# Patient Record
Sex: Male | Born: 1965
Health system: Southern US, Community
[De-identification: ages and names within clinical notes are randomized; demographics above are authoritative.]

## PROBLEM LIST (undated history)

## (undated) DIAGNOSIS — I2510 Atherosclerotic heart disease of native coronary artery without angina pectoris: Secondary | ICD-10-CM

## (undated) DIAGNOSIS — I10 Essential (primary) hypertension: Secondary | ICD-10-CM

## (undated) DIAGNOSIS — Z720 Tobacco use: Secondary | ICD-10-CM

## (undated) HISTORY — DX: Essential (primary) hypertension: I10

## (undated) HISTORY — DX: Tobacco use: Z72.0

## (undated) HISTORY — DX: Atherosclerotic heart disease of native coronary artery without angina pectoris: I25.10

---

## 1998-03-24 ENCOUNTER — Ambulatory Visit (HOSPITAL_BASED_OUTPATIENT_CLINIC_OR_DEPARTMENT_OTHER): Admission: RE | Admit: 1998-03-24 | Discharge: 1998-03-24 | Payer: Self-pay | Admitting: General Surgery

## 2012-12-08 ENCOUNTER — Encounter: Payer: Self-pay | Admitting: Cardiovascular Disease

## 2012-12-13 ENCOUNTER — Encounter: Payer: Self-pay | Admitting: Cardiovascular Disease

## 2012-12-13 ENCOUNTER — Ambulatory Visit (INDEPENDENT_AMBULATORY_CARE_PROVIDER_SITE_OTHER): Payer: 59 | Admitting: Cardiovascular Disease

## 2012-12-13 ENCOUNTER — Encounter (INDEPENDENT_AMBULATORY_CARE_PROVIDER_SITE_OTHER): Payer: Self-pay

## 2012-12-13 VITALS — BP 140/82 | HR 100 | Ht 69.0 in | Wt 174.0 lb

## 2012-12-13 DIAGNOSIS — I10 Essential (primary) hypertension: Secondary | ICD-10-CM

## 2012-12-13 DIAGNOSIS — E785 Hyperlipidemia, unspecified: Secondary | ICD-10-CM

## 2012-12-13 DIAGNOSIS — R079 Chest pain, unspecified: Secondary | ICD-10-CM

## 2012-12-13 DIAGNOSIS — R9431 Abnormal electrocardiogram [ECG] [EKG]: Secondary | ICD-10-CM

## 2012-12-13 NOTE — Assessment & Plan Note (Signed)
At 115 suggest diet Rx

## 2012-12-13 NOTE — Assessment & Plan Note (Signed)
Atypical abnormal ECG  F/U stress echo

## 2012-12-13 NOTE — Patient Instructions (Signed)
Your physician recommends that you schedule a follow-up appointment in:  NEXT  AVAILABLE WITH DR [facility] Your physician recommends that you continue on your current medications as directed. Please refer to the Current Medication list given to you today.  Your physician has requested that you regularly monitor and record your blood pressure readings at home. Please use the same machine at the same time of day to check your readings and record them to bring to your follow-up visit. WILL PURCHASE  AND  KEEP  B/P LOG   Your physician has requested that you have a stress echocardiogram. For further information please visit [email or web address] Please follow instruction sheet as given.

## 2012-12-13 NOTE — Assessment & Plan Note (Signed)
I suspect he needs Rx  Will see what BP is when he has ETT.  He will get BP cuff and see me next available appt to go over home readings Likely start low dose ACE especially if there is a history of protienuria

## 2012-12-13 NOTE — Progress Notes (Signed)
Patient ID: Bradley Mata, male   DOB: [birth year], 47 y.o.   MRN: [id number] 47 yo referred by Catha Gosselin Has had atypical chest pain last month or so.  He is borderline HTN.  No history of CAD.  ? Proteinuria before but not checked recently.  No NSAI's, ETOH excess or salt excess.  Pain is sharp intermitant not always exertional Was worse after he went to the gym for the first time in a long time.  Currently not on meds.  Works Social research officer, government for [employer].  When he checks BP at [employer] it has been elevated Reviewed labs from [place]  LDL 115 Cr 1.2    ROS: Denies fever, malais, weight loss, blurry vision, decreased visual acuity, cough, sputum, SOB, hemoptysis, pleuritic pain, palpitaitons, heartburn, abdominal pain, melena, lower extremity edema, claudication, or rash.  All other systems reviewed and negative   General: Affect appropriate Healthy:  appears stated age HEENT: normal Neck supple with no adenopathy JVP normal no bruits no thyromegaly Lungs clear with no wheezing and good diaphragmatic motion Heart:  S1/S2 no murmur,rub, gallop or click PMI normal Abdomen: benighn, BS positve, no tenderness, no AAA no bruit.  No HSM or HJR Distal pulses intact with no bruits No edema Neuro non-focal Skin warm and dry No muscular weakness  Medications Current Outpatient Prescriptions  Medication Sig Dispense Refill  . Clobetasol Prop Emollient Base 0.05 % emollient cream Apply topically 2 (two) times daily. As directed       No current facility-administered medications for this visit.    Allergies Review of patient's allergies indicates no known allergies.  Family History: No family history on file.  Social History: History   Social History  . Marital Status: Single    Spouse Name: N/A    Number of Children: N/A  . Years of Education: N/A   Occupational History  . Not on file.   Social History Main Topics  . Smoking status: Former Games developer  . Smokeless tobacco: Not  on file  . Alcohol Use: Not on file  . Drug Use: Not on file  . Sexual Activity: Not on file   Other Topics Concern  . Not on file   Social History Narrative  . No narrative on file    Electrocardiogram:  SR rate 73 T wave changes in leads 3,F  Assessment and Plan

## 2013-01-03 ENCOUNTER — Encounter: Payer: Self-pay | Admitting: Cardiology

## 2013-01-03 ENCOUNTER — Ambulatory Visit (HOSPITAL_COMMUNITY): Payer: 59 | Attending: Cardiology

## 2013-01-03 DIAGNOSIS — R079 Chest pain, unspecified: Secondary | ICD-10-CM | POA: Insufficient documentation

## 2013-01-03 DIAGNOSIS — R9431 Abnormal electrocardiogram [ECG] [EKG]: Secondary | ICD-10-CM

## 2013-01-03 NOTE — Progress Notes (Signed)
Stress Echocardiogram performed.  

## 2013-01-09 ENCOUNTER — Ambulatory Visit: Payer: 59 | Admitting: Cardiovascular Disease

## 2013-01-26 ENCOUNTER — Ambulatory Visit (INDEPENDENT_AMBULATORY_CARE_PROVIDER_SITE_OTHER): Payer: 59 | Admitting: Cardiovascular Disease

## 2013-01-26 ENCOUNTER — Encounter: Payer: Self-pay | Admitting: Cardiovascular Disease

## 2013-01-26 VITALS — BP 132/100 | HR 100 | Ht 69.0 in | Wt 178.0 lb

## 2013-01-26 DIAGNOSIS — E785 Hyperlipidemia, unspecified: Secondary | ICD-10-CM

## 2013-01-26 DIAGNOSIS — I10 Essential (primary) hypertension: Secondary | ICD-10-CM

## 2013-01-26 MED ORDER — LOSARTAN POTASSIUM 25 MG PO TABS: 25.0000 mg | ORAL_TABLET | Freq: Every day | ORAL | Status: DC

## 2013-01-26 NOTE — Progress Notes (Signed)
Patient ID: Bradley Mata, male   DOB: August 09, 1965, 48 y.o.   MRN: [id number] 48 yo referred by Bradley GosselinKevin Mata Has had atypical chest pain last month or so. He is borderline HTN. No history of CAD. ? Proteinuria before but not checked recently. No NSAI's, ETOH excess or salt excess. Pain is sharp intermitant not always exertional Was worse after he went to the gym for the first time in a long time. Currently not on meds. 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 When he checks BP at [employer] it has been elevated Reviewed labs from [place]   LDL 115  Cr 1.2   F/u stress echo 01/03/13 was normal  Home BP readings high Family history of high BP  Salt intake is high.  Denies ETOH.  Home BP readings reviewed and quite high   ROS: Denies fever, malais, weight loss, blurry vision, decreased visual acuity, cough, sputum, SOB, hemoptysis, pleuritic pain, palpitaitons, heartburn, abdominal pain, melena, lower extremity edema, claudication, or rash.  All other systems reviewed and negative  General: Affect appropriate Healthy:  appears stated age HEENT: normal Neck supple with no adenopathy JVP normal no bruits no thyromegaly Lungs clear with no wheezing and good diaphragmatic motion Heart:  S1/S2 no murmur, no rub, gallop or click PMI normal Abdomen: benighn, BS positve, no tenderness, no AAA no bruit.  No HSM or HJR Distal pulses intact with no bruits No edema Neuro non-focal Skin warm and dry No muscular weakness   Current Outpatient Prescriptions  Medication Sig Dispense Refill  . Clobetasol Prop Emollient Base 0.05 % emollient cream Apply topically 2 (two) times daily. As directed       No current facility-administered medications for this visit.    Allergies  Review of patient's allergies indicates no known allergies.  Electrocardiogram:  12/5  SR rate 75 normal ECG   Assessment and Plan

## 2013-01-26 NOTE — Assessment & Plan Note (Signed)
Start cozaar 25 mg generic continue home bp readings and decrease salt intake and f/u next available

## 2013-01-26 NOTE — Patient Instructions (Addendum)
Your physician recommends that you schedule a follow-up appointment in: [facility] Your physician has recommended you make the following change in your medication:  START LOSARTAN  25 MG  1  EVERY DAY

## 2013-01-26 NOTE — Assessment & Plan Note (Addendum)
Cholesterol is at goal.  Continue current dose of statin and diet Rx.  No myalgias or side effects.  F/U  LFT's in 6 months. No results found for this basename: LDLCALC  Labs with primary diet rx

## 2013-02-15 ENCOUNTER — Ambulatory Visit: Payer: 59 | Admitting: Cardiovascular Disease

## 2013-02-23 ENCOUNTER — Encounter: Payer: Self-pay | Admitting: Cardiovascular Disease

## 2013-02-23 ENCOUNTER — Telehealth: Payer: Self-pay | Admitting: Cardiovascular Disease

## 2013-05-03 NOTE — Telephone Encounter (Signed)
error 

## 2014-02-18 ENCOUNTER — Other Ambulatory Visit: Payer: Self-pay

## 2014-02-18 MED ORDER — LOSARTAN POTASSIUM 25 MG PO TABS: 25.0000 mg | ORAL_TABLET | Freq: Every day | ORAL | Status: DC

## 2014-07-25 ENCOUNTER — Other Ambulatory Visit: Payer: Self-pay | Admitting: *Deleted

## 2014-07-25 MED ORDER — LOSARTAN POTASSIUM 25 MG PO TABS: 25.0000 mg | ORAL_TABLET | Freq: Every day | ORAL | Status: DC

## 2014-07-29 ENCOUNTER — Other Ambulatory Visit: Payer: Self-pay

## 2014-07-29 MED ORDER — LOSARTAN POTASSIUM 25 MG PO TABS: 25.0000 mg | ORAL_TABLET | Freq: Every day | ORAL | Status: DC

## 2014-09-27 ENCOUNTER — Other Ambulatory Visit: Payer: Self-pay | Admitting: *Deleted

## 2014-09-27 MED ORDER — LOSARTAN POTASSIUM 25 MG PO TABS: 25.0000 mg | ORAL_TABLET | Freq: Every day | ORAL | Status: DC

## 2014-09-30 NOTE — Progress Notes (Signed)
Patient ID: Bradley Mata, male   DOB: 02-24-1965, 49 y.o.   MRN: [id number] 49 y.o. referred by Catha Gosselin in 2014 for  Atypical chest pain  He is borderline HTN. No history of CAD. Works doing Data processing manager for [employer]. When he checks BP at [employer] it has been elevated Reviewed labs from [place]   LDL 115  Cr 1.2   F/u stress echo 01/03/13 was normal  Salt intake is high.  Denies ETOH.  Home BP readings reviewed and quite high  Started on losartan  ROS: Denies fever, malais, weight loss, blurry vision, decreased visual acuity, cough, sputum, SOB, hemoptysis, pleuritic pain, palpitaitons, heartburn, abdominal pain, melena, lower extremity edema, claudication, or rash.  All other systems reviewed and negative  General: Affect appropriate Healthy:  appears stated age HEENT: normal Neck supple with no adenopathy JVP normal no bruits no thyromegaly Lungs clear with no wheezing and good diaphragmatic motion Heart:  S1/S2 no murmur, no rub, gallop or click PMI normal Abdomen: benighn, BS positve, no tenderness, no AAA no bruit.  No HSM or HJR Distal pulses intact with no bruits No edema Neuro non-focal Skin warm and dry No muscular weakness   Current Outpatient Prescriptions  Medication Sig Dispense Refill  . Clobetasol Prop Emollient Base 0.05 % emollient cream Apply topically 2 (two) times daily. As directed    . losartan (COZAAR) 25 MG tablet Take 1 tablet (25 mg total) by mouth daily. 30 tablet 0   No current facility-administered medications for this visit.    Allergies  Review of patient's allergies indicates no known allergies.  Electrocardiogram:  12/08/12  SR rate 75 normal ECG   Assessment and Plan Chest Pain :  Resolved atypical normal stress echo 2014 HTN:  Improved on ARB

## 2014-10-01 ENCOUNTER — Encounter: Payer: Self-pay | Admitting: Cardiovascular Disease

## 2014-10-01 NOTE — Progress Notes (Signed)
Patient ID: Bradley Mata, male   DOB: January 01, 1966, 49 y.o.   MRN: [id number] 49 y.o. referred by Catha Gosselin in 2014 for  Atypical chest pain  He is borderline HTN. No history of CAD. Working at [employer] now  When he checks BP at [employer] it has been ok  Reviewed labs from [place]   LDL 115  Cr 1.2   F/u stress echo 01/03/13 was normal  Salt intake is high.  Denies ETOH.  Home BP readings reviewed and quite high  Started on losartan  ROS: Denies fever, malais, weight loss, blurry vision, decreased visual acuity, cough, sputum, SOB, hemoptysis, pleuritic pain, palpitaitons, heartburn, abdominal pain, melena, lower extremity edema, claudication, or rash.  All other systems reviewed and negative  General: Affect appropriate Healthy:  appears stated age HEENT: normal Neck supple with no adenopathy JVP normal no bruits no thyromegaly Lungs clear with no wheezing and good diaphragmatic motion Heart:  S1/S2 no murmur, no rub, gallop or click PMI normal Abdomen: benighn, BS positve, no tenderness, no AAA no bruit.  No HSM or HJR Distal pulses intact with no bruits No edema Neuro non-focal Skin warm and dry No muscular weakness   Current Outpatient Prescriptions  Medication Sig Dispense Refill  . Clobetasol Prop Emollient Base 0.05 % emollient cream Apply 1 application topically 2 (two) times daily.     . COD LIVER OIL PO Take 3 tablets by mouth daily.    Marland Kitchen losartan (COZAAR) 25 MG tablet Take 1 tablet (25 mg total) by mouth daily. 30 tablet 0  . Multiple Vitamin (MULTIVITAMIN) capsule Take 2 capsules by mouth daily.    Marland Kitchen OVER THE COUNTER MEDICATION Take 1 tablet by mouth daily. Vinegar tablet.     No current facility-administered medications for this visit.    Allergies  Review of patient's allergies indicates no known allergies.  Electrocardiogram:  12/08/12  SR rate 75 normal ECG   Assessment and Plan Chest Pain :  Resolved atypical normal stress echo 2014 HTN:  Improved on  ARB  F/U with me in a year   [employer]

## 2014-10-03 ENCOUNTER — Ambulatory Visit (INDEPENDENT_AMBULATORY_CARE_PROVIDER_SITE_OTHER): Payer: 59 | Admitting: Cardiovascular Disease

## 2014-10-03 ENCOUNTER — Encounter: Payer: Self-pay | Admitting: Cardiovascular Disease

## 2014-10-03 VITALS — BP 130/102 | HR 90 | Ht 70.0 in | Wt 184.8 lb

## 2014-10-03 DIAGNOSIS — Z79899 Other long term (current) drug therapy: Secondary | ICD-10-CM | POA: Diagnosis not present

## 2014-10-03 DIAGNOSIS — I10 Essential (primary) hypertension: Secondary | ICD-10-CM

## 2014-10-03 MED ORDER — LOSARTAN POTASSIUM 25 MG PO TABS: 25.0000 mg | ORAL_TABLET | Freq: Every day | ORAL | Status: DC

## 2014-10-03 NOTE — Patient Instructions (Signed)
Medication Instructions:  Your physician recommends that you continue on your current medications as directed. Please refer to the Current Medication list given to you today.  Labwork: NONE  Testing/Procedures: NONE  Follw-Up: Your physician wants you to follow-up in: YEAR  WITH  DR Haywood Filler will receive a reminder letter in the mail two months in advance. If you don't receive a letter, please call our office to schedule the follow-up appointment.  Any Other Special Instructions Will Be Listed Below (If Applicable).

## 2015-02-26 ENCOUNTER — Other Ambulatory Visit: Payer: Self-pay | Admitting: *Deleted

## 2015-02-28 ENCOUNTER — Other Ambulatory Visit: Payer: Self-pay | Admitting: *Deleted

## 2015-02-28 MED ORDER — LOSARTAN POTASSIUM 25 MG PO TABS: 25.0000 mg | ORAL_TABLET | Freq: Every day | ORAL | Status: DC

## 2015-10-20 ENCOUNTER — Telehealth: Payer: Self-pay | Admitting: Cardiovascular Disease

## 2015-10-20 MED ORDER — LOSARTAN POTASSIUM 25 MG PO TABS: 25.0000 mg | ORAL_TABLET | Freq: Every day | ORAL | 1 refills | Status: DC

## 2015-10-20 NOTE — Telephone Encounter (Signed)
[facility]

## 2015-10-20 NOTE — Telephone Encounter (Signed)
Medication:Losartan 25Mg  Only have 5 pills left [facility]

## 2015-10-26 ENCOUNTER — Other Ambulatory Visit: Payer: Self-pay | Admitting: Cardiovascular Disease

## 2015-11-16 NOTE — Progress Notes (Deleted)
Patient ID: Bradley Mata, male   DOB: 07-Sep-1965, 50 y.o.   MRN: [id number] 50 y.o. referred by Bradley Mata in 2014 for  Atypical chest pain  He is borderline HTN. No history of CAD. Working at [employer] now  When he checks BP at [employer] it has been ok  Reviewed labs from Bradley Mata   LDL 115  Cr 1.2   F/u stress echo 01/03/13 was normal  Started on losartan 09/2014   Denies ETOH Salt intake better   ROS: Denies fever, malais, weight loss, blurry vision, decreased visual acuity, cough, sputum, SOB, hemoptysis, pleuritic pain, palpitaitons, heartburn, abdominal pain, melena, lower extremity edema, claudication, or rash.  All other systems reviewed and negative  General: Affect appropriate Healthy:  appears stated age HEENT: normal Neck supple with no adenopathy JVP normal no bruits no thyromegaly Lungs clear with no wheezing and good diaphragmatic motion Heart:  S1/S2 no murmur, no rub, gallop or click PMI normal Abdomen: benighn, BS positve, no tenderness, no AAA no bruit.  No HSM or HJR Distal pulses intact with no bruits No edema Neuro non-focal Skin warm and dry No muscular weakness   Current Outpatient Prescriptions  Medication Sig Dispense Refill  . Clobetasol Prop Emollient Base 0.05 % emollient cream Apply 1 application topically 2 (two) times daily.     . COD LIVER OIL PO Take 3 tablets by mouth daily.    Marland Kitchen. losartan (COZAAR) 25 MG tablet Take 1 tablet (25 mg total) by mouth daily. 30 tablet 1  . Multiple Vitamin (MULTIVITAMIN) capsule Take 2 capsules by mouth daily.    Marland Kitchen. OVER THE COUNTER MEDICATION Take 1 tablet by mouth daily. Vinegar tablet.     No current facility-administered medications for this visit.     Allergies  Patient has no known allergies.  Electrocardiogram:  12/08/12  SR rate 75 normal ECG   Assessment and Plan Chest Pain :  Resolved atypical normal stress echo 2014 HTN:  Improved on ARB  F/U with me in a year   Bradley Mata

## 2015-11-21 ENCOUNTER — Ambulatory Visit: Payer: 59 | Admitting: Cardiovascular Disease

## 2015-11-24 NOTE — Progress Notes (Signed)
CARDIOLOGY OFFICE NOTE  Date:  11/25/2015    Bradley BrimKevin L Huie Date of Birth: [birth year] Medical Record #[id number]  PCP:  Mickie HillierLITTLE,Koden LORNE, MD  Cardiologist:  Eden EmmsNishan  Chief Complaint  Patient presents with  . Hypertension    14 month check - seen for Dr. Eden EmmsNishan    History of Present Illness: Bradley Mata is a 50 y.o. male who presents today for a 14 month check. Seen for Dr. Eden EmmsNishan. Has a history of HTN and atypical chest pain.   Last seen in 09/2014 - felt to be doing ok.   Comes in today. Here alone. Needs Losartan refilled. Weight is up - attributes to eating out due to travel with work. No real exercise. He feels good. No chest pain. Breathing is good. He has no concerns other than the weight gain.  Labs are checked by PCP.  Past Medical History:  Diagnosis Date  . HTN (hypertension)     No past surgical history on file.   Medications: Current Outpatient Prescriptions  Medication Sig Dispense Refill  . losartan (COZAAR) 25 MG tablet Take 1 tablet (25 mg total) by mouth daily. 90 tablet 3   No current facility-administered medications for this visit.     Allergies: No Known Allergies  Social History: The patient  reports that he has quit smoking. He does not have any smokeless tobacco history on file.   Family History: The patient's family history includes Cancer in his mother.   Review of Systems: Please see the history of present illness.   Otherwise, the review of systems is positive for none.   All other systems are reviewed and negative.   Physical Exam: VS:  BP 138/90   Pulse 89   Ht 5\' 9"  (1.753 m)   Wt 192 lb 12.8 oz (87.5 kg)   BMI 28.47 kg/m  .  BMI Body mass index is 28.47 kg/m.  Wt Readings from Last 3 Encounters:  11/25/15 192 lb 12.8 oz (87.5 kg)  10/03/14 184 lb 12.8 oz (83.8 kg)  01/26/13 178 lb (80.7 kg)    General: Pleasant. Well developed, well nourished and in no acute distress.   HEENT: Normal.  Neck: Supple,  no JVD, carotid bruits, or masses noted.  Cardiac: Regular rate and rhythm. No murmurs, rubs, or gallops. No edema.  Respiratory:  Lungs are clear to auscultation bilaterally with normal work of breathing.  GI: Soft and nontender.  MS: No deformity or atrophy. Gait and ROM intact.  Skin: Warm and dry. Color is normal.  Neuro:  Strength and sensation are intact and no gross focal deficits noted.  Psych: Alert, appropriate and with normal affect.   LABORATORY DATA:  EKG:  EKG is ordered today. This demonstrates NSR.  No results found for: WBC, HGB, HCT, PLT, GLUCOSE, CHOL, TRIG, HDL, LDLDIRECT, LDLCALC, ALT, AST, NA, K, CL, CREATININE, BUN, CO2, TSH, PSA, INR, GLUF, HGBA1C, MICROALBUR  BNP (last 3 results) No results for input(s): BNP in the last 8760 hours.  ProBNP (last 3 results) No results for input(s): PROBNP in the last 8760 hours.   Other Studies Reviewed Today:  Stress Echo Study Conclusions from 12/2012  - Stress ECG conclusions: The stress ECG was normal. - Staged echo: Normal echo stress Impressions:  - No evidence for ischemia. Hypertensive BP response.  Assessment/Plan: 1. HTN - BP just fair - not checking at home and weight is up - discussed at length - seems motivated to make changes. Losartan  refilled today.   2. Atypical chest pain - resolved.   Current medicines are reviewed with the patient today.  The patient does not have concerns regarding medicines other than what has been noted above.  The following changes have been made:  See above.  Labs/ tests ordered today include:    Orders Placed This Encounter  Procedures  . EKG 12-Lead     Disposition:   FU with Dr. Eden EmmsNishan in one year or could follow going forward with PCP for management.   Patient is agreeable to this plan and will call if any problems develop in the interim.   Signed: Rosalio MacadamiaLori C. Arnel Wymer, RN, ANP-C 11/25/2015 8:53 AM  [facility] [address]  [address] [place], [place]  [address] Phone: [phone] Fax: [phone]

## 2015-11-25 ENCOUNTER — Encounter (INDEPENDENT_AMBULATORY_CARE_PROVIDER_SITE_OTHER): Payer: Self-pay

## 2015-11-25 ENCOUNTER — Ambulatory Visit (INDEPENDENT_AMBULATORY_CARE_PROVIDER_SITE_OTHER): Payer: BLUE CROSS/BLUE SHIELD | Admitting: Nurse Practitioner

## 2015-11-25 ENCOUNTER — Encounter: Payer: Self-pay | Admitting: Nurse Practitioner

## 2015-11-25 VITALS — BP 138/90 | HR 89 | Ht 69.0 in | Wt 192.8 lb

## 2015-11-25 DIAGNOSIS — I10 Essential (primary) hypertension: Secondary | ICD-10-CM

## 2015-11-25 MED ORDER — LOSARTAN POTASSIUM 25 MG PO TABS: 25.0000 mg | ORAL_TABLET | Freq: Every day | ORAL | 3 refills | Status: DC

## 2015-11-25 NOTE — Patient Instructions (Addendum)
We will be checking the following labs today - NONE  Medication Instructions:    Continue with your current medicines.   I sent your refill in for the Losartan    Testing/Procedures To Be Arranged:  N/A  Follow-Up:   See Dr. Eden EmmsNishan in one year.      Other Special Instructions:    Monitor your BP at home - goal is 130/80 Here are my tips to lose weight:  1. Drink only water. You do not need milk, juice, tea, soda or diet soda.  2. Do not eat anything "white". This includes white bread, potatoes, rice or mayo  3. Stay away from fried foods and sweets  4. Your portion should be the size of the palm of your hand.  5. Know what your weaknesses are and avoid.   6. Find an exercise you like and do it every day for 45 to 60 minutes.         If you need a refill on your cardiac medications before your next appointment, please call your pharmacy.   Call the [facility] office at [phone] if you have any questions, problems or concerns.

## 2016-12-17 ENCOUNTER — Other Ambulatory Visit: Payer: Self-pay | Admitting: *Deleted

## 2016-12-17 MED ORDER — LOSARTAN POTASSIUM 25 MG PO TABS: 25.0000 mg | ORAL_TABLET | Freq: Every day | ORAL | 0 refills | Status: DC

## 2017-01-31 NOTE — Progress Notes (Signed)
CARDIOLOGY OFFICE NOTE  Date:  02/01/2017    Bradley Mata Date of Birth: March 08, 1965 Medical Record #[id number]  PCP:  Catha Gosselin, MD  Cardiologist:  Eden Emms  No chief complaint on file.   History of Present Illness: Bradley Mata is a 52 y.o. 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 BP up in office today Has BP cuff at home he can use ECG today changed from 2017 with inferior T wave changes he denies chest pain   Past Medical History:  Diagnosis Date  . HTN (hypertension)     History reviewed. No pertinent surgical history.   Medications: Current Outpatient Medications  Medication Sig Dispense Refill  . losartan (COZAAR) 25 MG tablet Take 1 tablet (25 mg total) by mouth daily. Please keep upcoming appointment for further refills 90 tablet 0   No current facility-administered medications for this visit.     Allergies: No Known Allergies  Social History: The patient  reports that he has quit smoking. he has never used smokeless tobacco.   Family History: The patient's family history includes Cancer in his mother.   Review of Systems: Please see the history of present illness.   Otherwise, the review of systems is positive for none.   All other systems are reviewed and negative.   Physical Exam: VS:  BP (!) 130/98   Pulse 98   Ht 5\' 9"  (1.753 m)   Wt 194 lb 8 oz (88.2 kg)   BMI 28.72 kg/m  .  BMI Body mass index is 28.72 kg/m.  Wt Readings from Last 3 Encounters:  02/01/17 194 lb 8 oz (88.2 kg)  11/25/15 192 lb 12.8 oz (87.5 kg)  10/03/14 184 lb 12.8 oz (83.8 kg)   Affect appropriate Healthy:  appears stated age HEENT: normal Neck supple with no adenopathy JVP normal no bruits no thyromegaly Lungs clear with no wheezing and good diaphragmatic motion Heart:  S1/S2 no murmur, no rub, gallop  or click PMI normal Abdomen: benighn, BS positve, no tenderness, no AAA no bruit.  No HSM or HJR Distal pulses intact with no bruits No edema Neuro non-focal Skin warm and dry No muscular weakness    LABORATORY DATA:  EKG:   SR rate 89 normal 11/25/15  02/01/17  SR rate 98 inferolateral T wave changes   No results found for: WBC, HGB, HCT, PLT, GLUCOSE, CHOL, TRIG, HDL, LDLDIRECT, LDLCALC, ALT, AST, NA, K, CL, CREATININE, BUN, CO2, TSH, PSA, INR, GLUF, HGBA1C, MICROALBUR  BNP (last 3 results) No results for input(s): BNP in the last 8760 hours.  ProBNP (last 3 results) No results for input(s): PROBNP in the last 8760 hours.   Other Studies Reviewed Today:  Stress Echo Study Conclusions from 12/2012  - Stress ECG conclusions: The stress ECG was normal. - Staged echo: Normal echo stress Impressions:  - No evidence for ischemia. Hypertensive BP response.  Assessment/Plan: 1. HTN - Continue ARB needs to lose weight will monitor at home   2. Atypical chest pain - resolved. Normal stress echo 2014  However ECG today fairly different Than 2017 with focal T wave inversions in inferior leads favor stress echo to assess BP response To exercise, check LVH and r/o ischemia since T wave changes are focal   Current medicines are reviewed with the patient  today.  The patient does not have concerns regarding medicines other than what has been noted above.  The following changes have been made:  See above.  Labs/ tests ordered today include: stress echo   Orders Placed This Encounter  Procedures  . EKG 12-Lead     F/U with me in a year if stress echo normal   [employer] [facility]

## 2017-02-01 ENCOUNTER — Encounter: Payer: Self-pay | Admitting: Cardiovascular Disease

## 2017-02-01 ENCOUNTER — Encounter (INDEPENDENT_AMBULATORY_CARE_PROVIDER_SITE_OTHER): Payer: Self-pay

## 2017-02-01 ENCOUNTER — Ambulatory Visit (INDEPENDENT_AMBULATORY_CARE_PROVIDER_SITE_OTHER): Payer: 59 | Admitting: Cardiovascular Disease

## 2017-02-01 VITALS — BP 130/98 | HR 98 | Ht 69.0 in | Wt 194.5 lb

## 2017-02-01 DIAGNOSIS — R9431 Abnormal electrocardiogram [ECG] [EKG]: Secondary | ICD-10-CM

## 2017-02-01 DIAGNOSIS — Z0000 Encounter for general adult medical examination without abnormal findings: Secondary | ICD-10-CM | POA: Diagnosis not present

## 2017-02-01 DIAGNOSIS — I10 Essential (primary) hypertension: Secondary | ICD-10-CM

## 2017-02-01 DIAGNOSIS — Z23 Encounter for immunization: Secondary | ICD-10-CM | POA: Diagnosis not present

## 2017-02-01 NOTE — Patient Instructions (Addendum)
Medication Instructions:  Your physician recommends that you continue on your current medications as directed. Please refer to the Current Medication list given to you today.  Labwork: NONE  Testing/Procedures: Your physician has requested that you have a stress echocardiogram. For further information please visit [email or web address]. Please follow instruction sheet as given.  Follow-Up: Your physician wants you to follow-up in: 12 months with Dr. Nishan. You will receive a reminder letter in the mail two months in advance. If you don't receive a letter, please call our office to schedule the follow-up appointment.   If you need a refill on your cardiac medications before your next appointment, please call your pharmacy.    

## 2017-02-08 DIAGNOSIS — R7989 Other specified abnormal findings of blood chemistry: Secondary | ICD-10-CM | POA: Diagnosis not present

## 2017-02-08 DIAGNOSIS — E291 Testicular hypofunction: Secondary | ICD-10-CM | POA: Diagnosis not present

## 2017-02-08 DIAGNOSIS — N529 Male erectile dysfunction, unspecified: Secondary | ICD-10-CM | POA: Diagnosis not present

## 2017-02-09 ENCOUNTER — Other Ambulatory Visit (HOSPITAL_COMMUNITY): Payer: 59

## 2017-02-10 ENCOUNTER — Telehealth (HOSPITAL_COMMUNITY): Payer: Self-pay | Admitting: *Deleted

## 2017-02-10 NOTE — Telephone Encounter (Signed)
Patient given detailed instructions per Stress Test Requisition Sheet for test on 02/17/17 at 2:30.Patient Notified to arrive 30 minutes early, and that it is imperative to arrive on time for appointment to keep from having the test rescheduled.  Patient verbalized understanding. Daneil DolinSharon S Brooks

## 2017-02-10 NOTE — Telephone Encounter (Signed)
Left message on voicemail in reference to upcoming appointment scheduled for 02/17/17. Phone number given for a call back so details instructions can be given. Daneil DolinSharon S Brooks

## 2017-02-17 ENCOUNTER — Other Ambulatory Visit (HOSPITAL_COMMUNITY): Payer: 59

## 2017-02-18 ENCOUNTER — Telehealth (HOSPITAL_COMMUNITY): Payer: Self-pay | Admitting: Cardiovascular Disease

## 2017-02-21 NOTE — Telephone Encounter (Signed)
02/18/2017 10:37 AM Phone (Outgoing) Laurian BrimGilchrist, Ranger L (Self) [phone] (H)   Left Message - Called pt and lmsg for him to CB to r/s stress echo that was missed on 02/17/17.Marland Kitchen.RG    By Elita BooneGriffin, Marcy Bogosian A     Called pt and spoke with him to r/s his stress echo for 3/25 at 7:30 and he agreed to date and time.

## 2017-03-24 ENCOUNTER — Telehealth (HOSPITAL_COMMUNITY): Payer: Self-pay | Admitting: *Deleted

## 2017-03-24 NOTE — Telephone Encounter (Signed)
Left message on voicemail in reference to upcoming appointment scheduled for 03/28/17. Phone number given for a call back so details instructions can be given. Bradley DolinSharon S Mata

## 2017-03-28 ENCOUNTER — Telehealth (HOSPITAL_COMMUNITY): Payer: Self-pay | Admitting: Cardiovascular Disease

## 2017-03-28 ENCOUNTER — Ambulatory Visit (HOSPITAL_COMMUNITY): Payer: 59 | Attending: Cardiovascular Disease

## 2017-03-28 ENCOUNTER — Ambulatory Visit (HOSPITAL_COMMUNITY): Payer: 59

## 2017-03-28 DIAGNOSIS — R0989 Other specified symptoms and signs involving the circulatory and respiratory systems: Secondary | ICD-10-CM

## 2017-03-30 ENCOUNTER — Ambulatory Visit: Payer: 59 | Admitting: Skilled Nursing Facility1

## 2017-04-04 NOTE — Telephone Encounter (Signed)
User: Trina AoGRIFFIN, Tashana Haberl A Date/time: 04/04/17 3:04 PM  Comment: Called pt and lmsg for him to CB to r/s stress echo.Edmonia [email or web address].RG  Context:  Outcome: Left Message  Phone number: [phone] Phone Type: Home Phone  Comm. type: Telephone Call type: Outgoing  Contact: Laurian BrimGilchrist, Arch L Relation to patient: Self    User: Trina AoGRIFFIN, Hymen Arnett A Date/time: 04/01/17 10:11 AM  Comment: Called pt and lmsg for him to CB to r/s stress echo that was missed on 03/28/17  Context:  Outcome: Left Message  Phone number: [phone] Phone Type: Home Phone  Comm. type: Telephone Call type: Outgoing  Contact: Laurian BrimGilchrist, Ayron L Relation to patient: Self    User: Trina AoGRIFFIN, Wiley Flicker A Date/time: 03/28/17 9:19 AM  Comment: Called pt and lmsg for him to CB to r/s stress echo that was missed today.Edmonia [email or web address].RG  Context:  Outcome: Left Message  Phone number: [phone] Phone Type: Home Phone  Comm. type: Telephone Call type: Outgoing  Contact: Darylene PriceGilchrist, Leron L Relation to patient:

## 2017-04-07 ENCOUNTER — Telehealth: Payer: Self-pay

## 2017-04-07 NOTE — Telephone Encounter (Signed)
-----   Message from Elita Booneegina A Griffin sent at 04/07/2017  3:11 PM EDT ----- Regarding: Stress echo order  Aram BeechamHey Pam,  Mr. Alla GermanGilchrist no-showed for his Stress Echo on 03/28/17. I have reached out of him 3 times to reschedule his appt and I have not received a call back. He also no-showed for his stress echo on 02/17/17. So I will remove him from the workqueue.   Rene Kocheregina

## 2017-04-12 ENCOUNTER — Telehealth (HOSPITAL_COMMUNITY): Payer: Self-pay | Admitting: Cardiovascular Disease

## 2017-04-12 NOTE — Telephone Encounter (Signed)
Patient no-showed for stress echo on 02/17/17 and 03/28/17. 03/28/17 Called pt and lmsg for him to CB to r/s stress echo..RG 04/01/17 Called pt and lmsg for him to CB to r/s stress echo..RG 04/04/17 Called pt and lmsg for him to CB to r/s stress echo.Marland Kitchen.RG

## 2017-06-29 DIAGNOSIS — M542 Cervicalgia: Secondary | ICD-10-CM | POA: Diagnosis not present

## 2017-06-29 DIAGNOSIS — M545 Low back pain: Secondary | ICD-10-CM | POA: Diagnosis not present

## 2017-08-08 ENCOUNTER — Other Ambulatory Visit: Payer: Self-pay

## 2017-08-08 ENCOUNTER — Ambulatory Visit: Payer: 59 | Attending: Family Medicine | Admitting: Physical Therapy

## 2017-08-08 DIAGNOSIS — M6281 Muscle weakness (generalized): Secondary | ICD-10-CM | POA: Insufficient documentation

## 2017-08-08 DIAGNOSIS — R293 Abnormal posture: Secondary | ICD-10-CM

## 2017-08-08 DIAGNOSIS — M25511 Pain in right shoulder: Secondary | ICD-10-CM | POA: Diagnosis present

## 2017-08-08 NOTE — Patient Instructions (Signed)
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
## 2017-08-12 ENCOUNTER — Encounter: Payer: Self-pay | Admitting: Physical Therapy

## 2017-08-12 ENCOUNTER — Ambulatory Visit: Payer: 59 | Admitting: Physical Therapy

## 2017-08-12 DIAGNOSIS — R293 Abnormal posture: Secondary | ICD-10-CM

## 2017-08-12 DIAGNOSIS — M25511 Pain in right shoulder: Secondary | ICD-10-CM | POA: Diagnosis not present

## 2017-08-12 DIAGNOSIS — M6281 Muscle weakness (generalized): Secondary | ICD-10-CM

## 2017-08-12 NOTE — Therapy (Signed)
[facility] [address], [address] [place], [place], [address] Phone: [phone]   Fax:  [phone]  Physical Therapy Treatment  Patient Details  Name: Bradley BrimKevin L Teutsch MRN: [id number] Date of Birth: 27-Apr-1965 Referring Provider: Soundra PilonBrake, Andrew R, FNP   Encounter Date: 08/12/2017  PT End of Session - 08/12/17 0853    Visit Number  2    Date for PT Re-Evaluation  10/03/17    PT Start Time  0803    PT Stop Time  0847    PT Time Calculation (min)  44 min    Activity Tolerance  Patient tolerated treatment well       Past Medical History:  Diagnosis Date  . HTN (hypertension)     History reviewed. No pertinent surgical history.  There were no vitals filed for this visit.  Subjective Assessment - 08/12/17 0804    Subjective  [place].   My shoulder feels better since first visit.  Pain is deep inside shoulder.    (Pended)     Currently in Pain?  No/denies  (Pended)     Pain Score  0-No pain  (Pended)     Pain Orientation  Right  (Pended)     Aggravating Factors   any movement  (Pended)                        [facility] Adult PT Treatment/Exercise - 08/12/17 0001      Shoulder Exercises: Seated   Retraction  AROM;Both;10 reps    Other Seated Exercises  inferior shoulder distraction 5x     Other Seated Exercises  posterior capsule stretch       Shoulder Exercises: Stretch   Other Shoulder Stretches  review of initial HEP      Moist Heat Therapy   Number Minutes Moist Heat  5 Minutes   while discussing HEP   Moist Heat Location  Shoulder      Manual Therapy   Joint Mobilization  glenohumeral joint distraction grade 3, inferior mobs with movement 10x    Soft tissue mobilization  upper traps, periscapular muscles    Scapular Mobilization  medial/lateral glides grade 3 10x       Trigger Point Dry Needling - 08/12/17 [id number]    Consent Given?  Yes    Education Handout  Provided  Yes   discussed precautions including risk of pneumothorax   Muscles Treated Upper Body  Upper trapezius;Levator scapulae;Infraspinatus;Subscapularis    Upper Trapezius Response  Twitch reponse elicited;Palpable increased muscle length    Levator Scapulae Response  Twitch response elicited;Palpable increased muscle length    Infraspinatus Response  Palpable increased muscle length    Subscapularis Response  Palpable increased muscle length           PT Education - 08/12/17 0841    Education Details   Access Code: [email or web address]   posterior capsule stretch, rhomboid stretch, scapular retractions    Person(s) Educated  Patient    Methods  Explanation;Demonstration;Handout    Comprehension  Returned demonstration;Verbalized understanding       PT Short Term Goals - 08/08/17 1757      PT SHORT TERM GOAL #1   Title  ind with initial HEP    Time  4    Period  Weeks    Status  New    Target Date  09/05/17      PT SHORT TERM GOAL #2  Title  demonstrates ability to reach behind the back to T8 for improved reaching back    Time  4    Period  Weeks    Status  New    Target Date  09/05/17        PT Long Term Goals - 08/08/17 1759      PT LONG TERM GOAL #1   Title  ind with advanced HEP    Time  8    Period  Weeks    Status  New    Target Date  10/03/17      PT LONG TERM GOAL #2   Title  pt reports he can reach to grab a light object in the back seat of the car while sitting in the front seat due to improved soft tissue length and shoulder mobility    Time  8    Period  Weeks    Status  New    Target Date  10/03/17      PT LONG TERM GOAL #3   Title  pt will be able to demonstrate right shoulder flexion and abduction at least 150 degrees for overhead reaching    Time  8    Period  Weeks    Status  New    Target Date  10/03/17      PT LONG TERM GOAL #4   Title  FOTO < or = to 25% limited    Time  8    Period  Weeks    Status  New    Target Date  10/03/17             Plan - 08/12/17 0854    Clinical Impression Statement  The patient reports his shoulder has been feeling better since initial evaluation.  He has no pain at rest but continues to have diffuse shoulder pain with various movement.  Poor scapular mobility and decreased muscle length in upper trap and subscapularis, teres and pectorals but improved following manual therapy and dry needling.   Encouraged regular compliance with HEP for best long term outcomes.  Discussed importance of postural alignment in sitting for best shoulder biomechanics.      Rehab Potential  Good    PT Frequency  2x / week    PT Duration  8 weeks    PT Treatment/Interventions  ADLs/Self Care Home Management;Electrical Stimulation;Biofeedback;Cryotherapy;Moist Heat;Therapeutic activities;Therapeutic exercise;Neuromuscular re-education;Patient/family education;Taping;Dry needling;Passive range of motion;Manual techniques    PT Next Visit Plan  assess response to dry needling #1 and STM to upper trap, deltoids, pecs, posture, f/u on initial HEP and progress as needed    PT Home Exercise Plan   Access Code: [email or web address]        Patient will benefit from skilled therapeutic intervention in order to improve the following deficits and impairments:  Pain, Postural dysfunction, Increased muscle spasms, Decreased strength, Decreased range of motion, Impaired UE functional use  Visit Diagnosis: Acute pain of right shoulder  Muscle weakness (generalized)  Abnormal posture     Problem List Patient Active Problem List   Diagnosis Date Noted  . Chest pain 12/13/2012  . Elevated lipids 12/13/2012  . HTN (hypertension) 12/13/2012   Bradley Mata, PT 08/12/17 9:03 AM Phone: [phone] Fax: [phone]  Bradley Mata 08/12/2017, 9:02 AM  [facility] [address] [place], [place], [address] Phone: [phone]   Fax:  [phone]  Name: Bradley Mata MRN: [id number] Date of Birth: 08/08/1965

## 2017-08-12 NOTE — Patient Instructions (Signed)
      Access Code: [email or web address]  URL: [email or web address]  Date: 08/12/2017  Prepared by: Lavinia SharpsStacy Arrick Dutton   Exercises  Supine Shoulder External Rotation with Dowel - 5 reps - 1 sets - 10 sec hold - 2x daily - 7x weekly  Seated Cervical Sidebending Stretch - 5 reps - 1 sets - 10 sec hold - 2x daily - 7x weekly  Doorway Pec Stretch at 60 Degrees Abduction - 3 reps - 1 sets - 30 sec hold - 1x daily - 7x weekly  Seated Scapular Retraction - 10 reps - 1 sets - 1x daily - 7x weekly  Standing Shoulder Posterior Capsule Stretch - 3 reps - 1 sets - 20 hold - 1x daily - 7x weekly  Doorway Rhomboid Stretch - 20 reps - 1 sets - 1x daily - 7x weekly    Trigger Point Dry Needling  . What is Trigger Point Dry Needling (DN)? o DN is a physical therapy technique used to treat muscle pain and dysfunction. Specifically, DN helps deactivate muscle trigger points (muscle knots).  o A thin filiform needle is used to penetrate the skin and stimulate the underlying trigger point. The goal is for a local twitch response (LTR) to occur and for the trigger point to relax. No medication of any kind is injected during the procedure.   . What Does Trigger Point Dry Needling Feel Like?  o The procedure feels different for each individual patient. Some patients report that they do not actually feel the needle enter the skin and overall the process is not painful. Very mild bleeding may occur. However, many patients feel a deep cramping in the muscle in which the needle was inserted. This is the local twitch response.   Marland Kitchen. How Will I feel after the treatment? o Soreness is normal, and the onset of soreness may not occur for a few hours. Typically this soreness does not last longer than two days.  o Bruising is uncommon, however; ice can be used to decrease any possible bruising.  o In rare cases feeling tired or nauseous after the treatment is normal. In addition, your symptoms may get worse before they get  better, this period will typically not last longer than 24 hours.   . What Can I do After My Treatment? o Increase your hydration by drinking more water for the next 24 hours. o You may place ice or heat on the areas treated that have become sore, however, do not use heat on inflamed or bruised areas. Heat often brings more relief post needling. o You can continue your regular activities, but vigorous activity is not recommended initially after the treatment for 24 hours. o DN is best combined with other physical therapy such as strengthening, stretching, and other therapies.      Lavinia SharpsStacy Helon Wisinski PT [facility] [address], [address] [place], [place] [address] Phone # [phone] Fax [phone]

## 2017-08-16 ENCOUNTER — Ambulatory Visit: Payer: 59 | Admitting: Physical Therapy

## 2017-08-16 ENCOUNTER — Encounter: Payer: Self-pay | Admitting: Physical Therapy

## 2017-08-16 DIAGNOSIS — M25511 Pain in right shoulder: Secondary | ICD-10-CM

## 2017-08-16 DIAGNOSIS — M6281 Muscle weakness (generalized): Secondary | ICD-10-CM

## 2017-08-16 DIAGNOSIS — R293 Abnormal posture: Secondary | ICD-10-CM

## 2017-08-16 NOTE — Patient Instructions (Addendum)
    Access Code: [email or web address]  URL: [email or web address]  Date: 08/16/2017  Prepared by: Lavinia SharpsStacy Romaldo Saville   Exercises  Supine Shoulder External Rotation with Dowel - 5 reps - 1 sets - 10 sec hold - 2x daily - 7x weekly  Seated Cervical Sidebending Stretch - 5 reps - 1 sets - 10 sec hold - 2x daily - 7x weekly  Doorway Pec Stretch at 60 Degrees Abduction - 3 reps - 1 sets - 30 sec hold - 1x daily - 7x weekly  Seated Scapular Retraction - 10 reps - 1 sets - 1x daily - 7x weekly  Standing Shoulder Posterior Capsule Stretch - 3 reps - 1 sets - 20 hold - 1x daily - 7x weekly  Doorway Rhomboid Stretch - 20 reps - 1 sets - 1x daily - 7x weekly  Supine Shoulder Internal Rotation Stretch - 10 reps - 3 sets - 1x daily - 7x weekly  Standing Shoulder Internal Rotation Stretch with Towel - 10 reps - 1 sets - 1x daily - 7x weekly  Supine Shoulder External Rotation Stretch - 3 reps - 1 sets - 20 hold - 1x daily - 7x weekly  Supine Shoulder Flexion PROM - 3 reps - 1 sets - 20 hold - 1x daily - 7x weekly     Lavinia SharpsStacy Tameko Halder PT [facility] [address], [address] [place], [place] [address] Phone # [phone] Fax [phone]

## 2017-08-16 NOTE — Therapy (Signed)
[facility] [address], [address] [place], [place], [address] Phone: [phone]   Fax:  [phone]  Physical Therapy Treatment  Patient Details  Name: Bradley Mata MRN: [id number] Date of Birth: Jan 01, 1966 Referring Provider: Soundra PilonBrake, Andrew R, FNP   Encounter Date: 08/16/2017  PT End of Session - 08/16/17 1640    Visit Number  3    Date for PT Re-Evaluation  10/03/17    PT Start Time  0800    PT Stop Time  0845    PT Time Calculation (min)  45 min    Activity Tolerance  Patient tolerated treatment well       Past Medical History:  Diagnosis Date  . HTN (hypertension)     History reviewed. No pertinent surgical history.  There were no vitals filed for this visit.  Subjective Assessment - 08/16/17 0801    Subjective  Neck and back are good.  Shoulder has been better since starting PT.  About 70% better.  I feel it with turning in the bed but less intense.    Currently in Pain?  Yes    Pain Score  3     Pain Location  Shoulder    Pain Type  Acute pain                       [facility] Adult PT Treatment/Exercise - 08/16/17 0001      Shoulder Exercises: ROM/Strengthening   Ranger  L20 15x      Shoulder Exercises: Stretch   Corner Stretch  3 reps;20 seconds    Internal Rotation Stretch  5 reps    Internal Rotation Stretch Limitations  at counter top    External Rotation Stretch  3 reps;20 seconds   supine   Other Shoulder Stretches  supine flexion 3 min static hold with moist heat      Moist Heat Therapy   Number Minutes Moist Heat  5 Minutes   during static stretches   Moist Heat Location  Shoulder      Manual Therapy   Joint Mobilization  glenohumeral joint distraction grade 3, inferior mobs with movement 10x    Soft tissue mobilization  upper traps, periscapular muscles, pectorals    Scapular Mobilization  medial/lateral glides grade 3 10x       Trigger Point Dry Needling - 08/16/17  1638    Consent Given?  Yes    Muscles Treated Upper Body  Pectoralis major;Pectoralis minor    Infraspinatus Response  Palpable increased muscle length    Subscapularis Response  Palpable increased muscle length      Right      PT Education - 08/16/17 1639    Education Details  static shoulder stretches flexion, internal rotation, external rotation     Person(s) Educated  Patient    Methods  Explanation;Demonstration;Handout    Comprehension  Returned demonstration;Verbalized understanding       PT Short Term Goals - 08/08/17 1757      PT SHORT TERM GOAL #1   Title  ind with initial HEP    Time  4    Period  Weeks    Status  New    Target Date  09/05/17      PT SHORT TERM GOAL #2   Title  demonstrates ability to reach behind the back to T8 for improved reaching back    Time  4    Period  Weeks  Status  New    Target Date  09/05/17        PT Long Term Goals - 08/08/17 1759      PT LONG TERM GOAL #1   Title  ind with advanced HEP    Time  8    Period  Weeks    Status  New    Target Date  10/03/17      PT LONG TERM GOAL #2   Title  pt reports he can reach to grab a light object in the back seat of the car while sitting in the front seat due to improved soft tissue length and shoulder mobility    Time  8    Period  Weeks    Status  New    Target Date  10/03/17      PT LONG TERM GOAL #3   Title  pt will be able to demonstrate right shoulder flexion and abduction at least 150 degrees for overhead reaching    Time  8    Period  Weeks    Status  New    Target Date  10/03/17      PT LONG TERM GOAL #4   Title  FOTO < or = to 25% limited    Time  8    Period  Weeks    Status  New    Target Date  10/03/17            Plan - 08/16/17 0837    Clinical Impression Statement  The patient reports a 70% improvement in 3 PT visits.  He continues to have decreased soft tissue lengths especially pectorals and subscapularis  and glenohumeral capsular tightness.   Improved muscle length following DN, manual therapy and ex.      Rehab Potential  Good    PT Frequency  2x / week    PT Duration  8 weeks    PT Treatment/Interventions  ADLs/Self Care Home Management;Electrical Stimulation;Biofeedback;Cryotherapy;Moist Heat;Therapeutic activities;Therapeutic exercise;Neuromuscular re-education;Patient/family education;Taping;Dry needling;Passive range of motion;Manual techniques    PT Next Visit Plan  assess response to dry needling #2 and STM to upper trap, deltoids, pecs, posture, f/u on initial HEP and progress as needed;  recheck shoulder ROM    PT Home Exercise Plan   Access Code: [email or web address]        Patient will benefit from skilled therapeutic intervention in order to improve the following deficits and impairments:  Pain, Postural dysfunction, Increased muscle spasms, Decreased strength, Decreased range of motion, Impaired UE functional use  Visit Diagnosis: Acute pain of right shoulder  Muscle weakness (generalized)  Abnormal posture     Problem List Patient Active Problem List   Diagnosis Date Noted  . Chest pain 12/13/2012  . Elevated lipids 12/13/2012  . HTN (hypertension) 12/13/2012   Lavinia SharpsStacy Modestine Scherzinger, PT 08/16/17 4:48 PM Phone: [phone] Fax: [phone]  Vivien PrestoSimpson, [name] 08/16/2017, 4:48 PM  [facility] [address], [address] [place], [place], [address] Phone: [phone]   Fax:  [phone]  Name: Bradley Mata MRN: [id number] Date of Birth: July 26, 1965

## 2017-08-18 ENCOUNTER — Ambulatory Visit: Payer: 59 | Admitting: Physical Therapy

## 2017-08-18 DIAGNOSIS — M25511 Pain in right shoulder: Secondary | ICD-10-CM

## 2017-08-18 DIAGNOSIS — M6281 Muscle weakness (generalized): Secondary | ICD-10-CM

## 2017-08-18 DIAGNOSIS — R293 Abnormal posture: Secondary | ICD-10-CM

## 2017-08-18 NOTE — Therapy (Signed)
[facility] [address], [address] [place], [place], [address] Phone: [phone]   Fax:  [phone]  Physical Therapy Treatment  Patient Details  Name: Bradley BrimKevin L Mata MRN: [id number] Date of Birth: 01-May-1965 Referring Provider: Soundra PilonBrake, Andrew R, FNP   Encounter Date: 08/18/2017  PT End of Session - 08/18/17 0851    Visit Number  4    Date for PT Re-Evaluation  10/03/17    PT Start Time  0846    PT Stop Time  0926    PT Time Calculation (min)  40 min    Activity Tolerance  Patient tolerated treatment well    Behavior During Therapy  [facility] for tasks assessed/performed       Past Medical History:  Diagnosis Date  . HTN (hypertension)     No past surgical history on file.  There were no vitals filed for this visit.  Subjective Assessment - 08/18/17 0850    Subjective  Pt states the only thing he has noticed is reaching down on the back floor of the car when he is in the front.  Denies pain.    Patient Stated Goals  be able to do normal activities    Currently in Pain?  No/denies                       [facility]/Ex[facility] - 08/18/17 0001      Shoulder Exercises: Supine   Protraction  AROM;Right;15 reps      Shoulder Exercises: Seated   External Rotation  Strengthening;Both;20 reps;Theraband    Theraband Level (Shoulder External Rotation)  Level 2 (Red)    Diagonals  Strengthening;Right;20 reps;Theraband    Theraband Level (Shoulder Diagonals)  Level 2 (Red)      Shoulder Exercises: Standing   External Rotation  Strengthening;Both;20 reps;Theraband    Theraband Level (Shoulder External Rotation)  Level 2 (Red)    Flexion  Strengthening;Both;10 reps;Weights    Shoulder Flexion Weight (lbs)  1    ABduction  Strengthening;Both;10 reps;Weights    Shoulder ABduction Weight (lbs)  1    Diagonals  Strengthening;Right;20 reps;Theraband    Theraband Level (Shoulder Diagonals)  Level 2  (Red)      Shoulder Exercises: Pulleys   Other Pulley Exercises  IR stretch - 2 min      Shoulder Exercises: Therapy Ball   Other Therapy Ball Exercises  wall push up and serratus push on ball - 15 x      Shoulder Exercises: ROM/Strengthening   UBE (Upper Arm Bike)  L1 3x3 fwd/back      Manual Therapy   Joint Mobilization  glenohumeral joint distraction grade 3, A/P and inferior mobs grade III    Soft tissue mobilization  upper trap, pecs, anterior deltoid               PT Short Term Goals - 08/18/17 1021      PT SHORT TERM GOAL #1   Title  ind with initial HEP    Status  Achieved      PT SHORT TERM GOAL #2   Title  demonstrates ability to reach behind the back to T8 for improved reaching back    Status  On-going        PT Long Term Goals - 08/08/17 1759      PT LONG TERM GOAL #1   Title  ind with advanced HEP    Time  8  Period  Weeks    Status  New    Target Date  10/03/17      PT LONG TERM GOAL #2   Title  pt reports he can reach to grab a light object in the back seat of the car while sitting in the front seat due to improved soft tissue length and shoulder mobility    Time  8    Period  Weeks    Status  New    Target Date  10/03/17      PT LONG TERM GOAL #3   Title  pt will be able to demonstrate right shoulder flexion and abduction at least 150 degrees for overhead reaching    Time  8    Period  Weeks    Status  New    Target Date  10/03/17      PT LONG TERM GOAL #4   Title  FOTO < or = to 25% limited    Time  8    Period  Weeks    Status  New    Target Date  10/03/17            Plan - 08/18/17 1015    Clinical Impression Statement  Pt reports continued improvement and only certain movements like reaching back cause pain.  Pt continues to need cues to prevent shoulder elevation during exercises.  He did well with serratus exercises after verbal and tactile cues.  Pt will benefit from skilled PT to porgress scap and GH joint stability  with improved soft tissue length.      PT Treatment/Interventions  ADLs/Self Care Home Management;Electrical Stimulation;Biofeedback;Cryotherapy;Moist Heat;Therapeutic activities;Therapeutic exercise;Neuromuscular re-education;Patient/family education;Taping;Dry needling;Passive range of motion;Manual techniques    PT Home Exercise Plan   Access Code: [email or web address]     Consulted and Agree with Plan of Care  Patient       Patient will benefit from skilled therapeutic intervention in order to improve the following deficits and impairments:  Pain, Postural dysfunction, Increased muscle spasms, Decreased strength, Decreased range of motion, Impaired UE functional use  Visit Diagnosis: Acute pain of right shoulder  Muscle weakness (generalized)  Abnormal posture     Problem List Patient Active Problem List   Diagnosis Date Noted  . Chest pain 12/13/2012  . Elevated lipids 12/13/2012  . HTN (hypertension) 12/13/2012    Bradley Mata, PT 08/18/2017, 10:21 AM  [facility] [address], [address] [place], [place], [address] Phone: [phone]   Fax:  [phone]  Name: Bradley Mata MRN: [id number] Date of Birth: 04-24-1965

## 2017-08-25 ENCOUNTER — Encounter: Payer: Self-pay | Admitting: Physical Therapy

## 2017-08-25 ENCOUNTER — Ambulatory Visit: Payer: 59 | Admitting: Physical Therapy

## 2017-08-25 DIAGNOSIS — R293 Abnormal posture: Secondary | ICD-10-CM

## 2017-08-25 DIAGNOSIS — M25511 Pain in right shoulder: Secondary | ICD-10-CM | POA: Diagnosis not present

## 2017-08-25 DIAGNOSIS — M6281 Muscle weakness (generalized): Secondary | ICD-10-CM

## 2017-08-25 NOTE — Therapy (Signed)
[facility] [address], [address] [place], [place], [address] Phone: [phone]   Fax:  [phone]  Physical Therapy Treatment  Patient Details  Name: Bradley Mata MRN: [id number] Date of Birth: 1965-08-30 Referring Provider: Soundra PilonBrake, Andrew R, FNP   Encounter Date: 08/25/2017  PT End of Session - 08/25/17 0807    Visit Number  5    Date for PT Re-Evaluation  10/03/17    PT Start Time  0805    PT Stop Time  0844    PT Time Calculation (min)  39 min    Activity Tolerance  Patient tolerated treatment well    Behavior During Therapy  [facility] for tasks assessed/performed       Past Medical History:  Diagnosis Date  . HTN (hypertension)     History reviewed. No pertinent surgical history.  There were no vitals filed for this visit.  Subjective Assessment - 08/25/17 0810    Subjective  States he still has a little pain in the back when reaching back but feels like it is getting better.    Patient Stated Goals  be able to do normal activities    Currently in Pain?  No/denies                       [facility] - 08/25/17 0001      Exercises   Exercises  Shoulder      Shoulder Exercises: Standing   External Rotation  Strengthening;Right;Weights    External Rotation Weight (lbs)  --   1 plate   Extension  Strengthening;Both;20 reps;Weights    Extension Weight (lbs)  25    Row  Strengthening;Both;20 reps;Weights    Row Weight (lbs)  25      Manual Therapy   Soft tissue mobilization  upper trap, pecs, anterior deltoid, RTC, triceps       Trigger Point Dry Needling - 08/25/17 0844    Consent Given?  Yes    Muscles Treated Upper Body  Pectoralis major;Pectoralis minor;Supraspinatus    Upper Trapezius Response  Twitch reponse elicited;Palpable increased muscle length    Pectoralis Major Response  Twitch response elicited;Palpable increased muscle length    Pectoralis Minor  Response  Twitch response elicited;Palpable increased muscle length    Supraspinatus Response  Twitch response elicited;Palpable increased muscle length    Infraspinatus Response  Twitch response elicited;Palpable increased muscle length       skilled Palpation of soft tissue      PT Short Term Goals - 08/18/17 1021      PT SHORT TERM GOAL #1   Title  ind with initial HEP    Status  Achieved      PT SHORT TERM GOAL #2   Title  demonstrates ability to reach behind the back to T8 for improved reaching back    Status  On-going        PT Long Term Goals - 08/25/17 0851      PT LONG TERM GOAL #1   Title  ind with advanced HEP    Status  On-going      PT LONG TERM GOAL #2   Title  pt reports he can reach to grab a light object in the back seat of the car while sitting in the front seat due to improved soft tissue length and shoulder mobility    Status  On-going      PT LONG TERM GOAL #3  Title  pt will be able to demonstrate right shoulder flexion and abduction at least 150 degrees for overhead reaching    Status  On-going      PT LONG TERM GOAL #4   Title  FOTO < or = to 25% limited    Status  On-going            Plan - 08/25/17 0820    Clinical Impression Statement  Pt is doing well and able to progress resistance.  He continues to have muscle spasms throughout right shoulder and palpated large trigger point in tricep.  Improved soft tissue length with manaul and dry needling techniques were observed.  Pt will benefit from skilled PT to continue to address impairments for full functoin of UE.      PT Treatment/Interventions  ADLs/Self Care Home Management;Electrical Stimulation;Biofeedback;Cryotherapy;Moist Heat;Therapeutic activities;Therapeutic exercise;Neuromuscular re-education;Patient/family education;Taping;Dry needling;Passive range of motion;Manual techniques    PT Next Visit Plan  assess response to dry needling #3 and STM to upper trap, deltoids, pecs,  posture, f/u on initial HEP and progress as needed;  recheck shoulder ROM    PT Home Exercise Plan   Access Code: [email or web address]     Consulted and Agree with Plan of Care  Patient       Patient will benefit from skilled therapeutic intervention in order to improve the following deficits and impairments:  Pain, Postural dysfunction, Increased muscle spasms, Decreased strength, Decreased range of motion, Impaired UE functional use  Visit Diagnosis: Acute pain of right shoulder  Muscle weakness (generalized)  Abnormal posture     Problem List Patient Active Problem List   Diagnosis Date Noted  . Chest pain 12/13/2012  . Elevated lipids 12/13/2012  . HTN (hypertension) 12/13/2012    Bradley Mata, PT 08/25/2017, 8:52 AM  [facility] [address], [address] [place], [place], [address] Phone: [phone]   Fax:  [phone]  Name: Bradley Mata MRN: [id number] Date of Birth: 1965/07/27

## 2017-08-26 ENCOUNTER — Encounter: Payer: Self-pay | Admitting: Physical Therapy

## 2017-08-26 ENCOUNTER — Ambulatory Visit: Payer: 59 | Admitting: Physical Therapy

## 2017-08-26 DIAGNOSIS — M25511 Pain in right shoulder: Secondary | ICD-10-CM

## 2017-08-26 DIAGNOSIS — R293 Abnormal posture: Secondary | ICD-10-CM

## 2017-08-26 DIAGNOSIS — M6281 Muscle weakness (generalized): Secondary | ICD-10-CM

## 2017-08-26 NOTE — Therapy (Signed)
[facility] [address], [address] [place], [place], [address] Phone: [phone]   Fax:  [phone]  Physical Therapy Treatment  Patient Details  Name: Bradley Mata MRN: [id number] Date of Birth: December 19, 1965 Referring Provider: Soundra PilonBrake, Andrew R, FNP   Encounter Date: 08/26/2017  PT End of Session - 08/26/17 0811    Visit Number  6    Date for PT Re-Evaluation  10/03/17    PT Start Time  0811   late   PT Stop Time  0846    PT Time Calculation (min)  35 min    Activity Tolerance  Patient tolerated treatment well    Behavior During Therapy  [facility] for tasks assessed/performed       Past Medical History:  Diagnosis Date  . HTN (hypertension)     History reviewed. No pertinent surgical history.  There were no vitals filed for this visit.  Subjective Assessment - 08/26/17 0812    Subjective  Pt states there is no serious pain.      Patient Stated Goals  be able to do normal activities    Currently in Pain?  No/denies         [facility] - 08/26/17 0001      AROM   Right Shoulder Flexion  140 Degrees    Right Shoulder ABduction  140 Degrees    Right Shoulder Internal Rotation  --   T12; T11 after manual treatment                  [facility] Adult PT Treatment/Exercise - 08/26/17 0001      Lumbar Exercises: Quadruped   Other Quadruped Lumbar Exercises  thoracic rotation ER and threading - 5 x each way      Shoulder Exercises: Therapy Ball   Other Therapy Ball Exercises  scap stab on wall - ABCs      Shoulder Exercises: ROM/Strengthening   UBE (Upper Arm Bike)  L1 3x3 fwd/back      Manual Therapy   Soft tissue mobilization  upper trap, rhomboids, thoracic paraspinals,        Trigger Point Dry Needling - 08/26/17 0951    Consent Given?  Yes    Muscles Treated Upper Body  Subscapularis;Rhomboids;Upper trapezius   thoracic multifidi (all Rt side)   Upper Trapezius Response  Twitch reponse  elicited;Palpable increased muscle length    Rhomboids Response  Twitch response elicited;Palpable increased muscle length    Subscapularis Response  Twitch response elicited;Palpable increased muscle length             PT Short Term Goals - 08/26/17 1003      PT SHORT TERM GOAL #2   Title  demonstrates ability to reach behind the back to T8 for improved reaching back    Baseline  T11    Status  On-going        PT Long Term Goals - 08/26/17 1002      PT LONG TERM GOAL #1   Title  ind with advanced HEP    Status  On-going      PT LONG TERM GOAL #2   Title  pt reports he can reach to grab a light object in the back seat of the car while sitting in the front seat due to improved soft tissue length and shoulder mobility    Baseline  still feeling sore but it is better    Status  On-going  PT LONG TERM GOAL #3   Title  pt will be able to demonstrate right shoulder flexion and abduction at least 150 degrees for overhead reaching    Baseline  140 deg      PT LONG TERM GOAL #4   Title  FOTO < or = to 25% limited    Status  On-going            Plan - 08/26/17 1004    Clinical Impression Statement  pt demonstrates increased ROM. He is showing improvements with scapular stability, but demonstrates more limited rotation on rt side.  He continues to have muscle spasm.  Pt was educated in focusing on IR stretches.  He will benefit from skilled PT to continue with POC.    PT Treatment/Interventions  ADLs/Self Care Home Management;Electrical Stimulation;Biofeedback;Cryotherapy;Moist Heat;Therapeutic activities;Therapeutic exercise;Neuromuscular re-education;Patient/family education;Taping;Dry needling;Passive range of motion;Manual techniques    PT Next Visit Plan  DN/STM to subscap, tricep, rhomboids, lats, child's pose to stretch lats, thoracic ext and rotation ROM, scap stability    PT Home Exercise Plan   Access Code: [email or web address]     Consulted and Agree with Plan of Care   Patient       Patient will benefit from skilled therapeutic intervention in order to improve the following deficits and impairments:  Pain, Postural dysfunction, Increased muscle spasms, Decreased strength, Decreased range of motion, Impaired UE functional use  Visit Diagnosis: Acute pain of right shoulder  Muscle weakness (generalized)  Abnormal posture     Problem List Patient Active Problem List   Diagnosis Date Noted  . Chest pain 12/13/2012  . Elevated lipids 12/13/2012  . HTN (hypertension) 12/13/2012    Vincente Poli, PT 08/26/2017, 10:09 AM  [facility] [address] [place], [place], [address] Phone: [phone]   Fax:  [phone]  Name: Bradley Mata MRN: [id number] Date of Birth: [birth year]

## 2017-08-29 ENCOUNTER — Encounter: Payer: Self-pay | Admitting: Physical Therapy

## 2017-08-29 ENCOUNTER — Ambulatory Visit: Payer: 59 | Admitting: Physical Therapy

## 2017-08-29 DIAGNOSIS — M25511 Pain in right shoulder: Secondary | ICD-10-CM

## 2017-08-29 DIAGNOSIS — R293 Abnormal posture: Secondary | ICD-10-CM

## 2017-08-29 DIAGNOSIS — M6281 Muscle weakness (generalized): Secondary | ICD-10-CM

## 2017-08-29 NOTE — Therapy (Signed)
[facility] [address], [address] [place], [place], [address] Phone: [phone]   Fax:  [phone]  Physical Therapy Treatment  Patient Details  Name: Bradley Mata MRN: [id number] Date of Birth: 1965/08/02 Referring Provider: Soundra PilonBrake, Andrew R, FNP   Encounter Date: 08/29/2017  PT End of Session - 08/29/17 0803    Visit Number  7    Date for PT Re-Evaluation  10/03/17    PT Start Time  0803    PT Stop Time  0843    PT Time Calculation (min)  40 min    Activity Tolerance  Patient tolerated treatment well    Behavior During Therapy  [facility] for tasks assessed/performed       Past Medical History:  Diagnosis Date  . HTN (hypertension)     History reviewed. No pertinent surgical history.  There were no vitals filed for this visit.  Subjective Assessment - 08/29/17 0805    Subjective  Pt he is not having any pain yet today.    Patient Stated Goals  be able to do normal activities    Currently in Pain?  No/denies                       [facility] Treatment/Exercise - 08/29/17 0001      Exercises   Exercises  Shoulder      Shoulder Exercises: Seated   External Rotation  Strengthening;Weights;20 reps    Other Seated Exercises  seated on green ball - W and extension - 30 lb - 2 x 15 W; 1x15 ext      Shoulder Exercises: Standing   External Rotation  Strengthening;Right;Theraband;Weights;20 reps    Theraband Level (Shoulder External Rotation)  Level 1 (Yellow)   at 45 deg abduction   External Rotation Weight (lbs)  --   1plate   Internal Rotation  Strengthening;20 reps;Weights   1 plate     Shoulder Exercises: Pulleys   Other Pulley Exercises  IR stretch - 2 min      Shoulder Exercises: Therapy Ball   Other Therapy Ball Exercises  scap stab on wall - ABCs      Shoulder Exercises: ROM/Strengthening   Other ROM/Strengthening Exercises  child's pose to stretch lats with SB to left - 2 x 30 sec        Trigger Point Dry Needling - 08/29/17 0842    Consent Given?  Yes    Muscles Treated Upper Body  --   anterior and lateral deltoids   Pectoralis Major Response  Twitch response elicited;Palpable increased muscle length             PT Short Term Goals - 08/26/17 1003      PT SHORT TERM GOAL #2   Title  demonstrates ability to reach behind the back to T8 for improved reaching back    Baseline  T11    Status  On-going        PT Long Term Goals - 08/26/17 1002      PT LONG TERM GOAL #1   Title  ind with advanced HEP    Status  On-going      PT LONG TERM GOAL #2   Title  pt reports he can reach to grab a light object in the back seat of the car while sitting in the front seat due to improved soft tissue length and shoulder mobility    Baseline  still feeling sore but  it is better    Status  On-going      PT LONG TERM GOAL #3   Title  pt will be able to demonstrate right shoulder flexion and abduction at least 150 degrees for overhead reaching    Baseline  140 deg      PT LONG TERM GOAL #4   Title  FOTO < or = to 25% limited    Status  On-going            Plan - 08/29/17 0847    Clinical Impression Statement  Pt reports feeling better after manual techniques.  He had good twitch response in pec major.  Pt needed verbal and tactile cues to perfrm exercises correctly and was visibly fatigued with shoulder ER.  Pt was able to make it through the whole alphabet with shoulder stability on the wall today.  Pt will benefit from skilled PT to progress strength and ROM.     PT Treatment/Interventions  ADLs/Self Care Home Management;Electrical Stimulation;Biofeedback;Cryotherapy;Moist Heat;Therapeutic activities;Therapeutic exercise;Neuromuscular re-education;Patient/family education;Taping;Dry needling;Passive range of motion;Manual techniques    PT Next Visit Plan  DN/STM to subscap, tricep, rhomboids, lats, child's pose to stretch lats, thoracic ext and rotation ROM,  scap stability    PT Home Exercise Plan   Access Code: [email or web address]     Consulted and Agree with Plan of Care  Patient       Patient will benefit from skilled therapeutic intervention in order to improve the following deficits and impairments:  Pain, Postural dysfunction, Increased muscle spasms, Decreased strength, Decreased range of motion, Impaired UE functional use  Visit Diagnosis: Acute pain of right shoulder  Muscle weakness (generalized)  Abnormal posture     Problem List Patient Active Problem List   Diagnosis Date Noted  . Chest pain 12/13/2012  . Elevated lipids 12/13/2012  . HTN (hypertension) 12/13/2012    Vincente Poli, PT 08/29/2017, 8:51 AM  [facility] [address] [place], [place], [address] Phone: [phone]   Fax:  [phone]  Name: Bradley Mata MRN: [id number] Date of Birth: [birth year]

## 2017-09-01 ENCOUNTER — Encounter: Payer: Self-pay | Admitting: Physical Therapy

## 2017-09-01 ENCOUNTER — Ambulatory Visit: Payer: 59 | Admitting: Physical Therapy

## 2017-09-01 DIAGNOSIS — R293 Abnormal posture: Secondary | ICD-10-CM

## 2017-09-01 DIAGNOSIS — M25511 Pain in right shoulder: Secondary | ICD-10-CM

## 2017-09-01 DIAGNOSIS — M6281 Muscle weakness (generalized): Secondary | ICD-10-CM

## 2017-09-01 NOTE — Therapy (Signed)
[facility] [address], [place], [place], [address] Phone: [phone]   Fax:  [phone]  Physical Therapy Treatment  Patient Details  Name: Bradley Mata MRN: [id number] Date of Birth: 07/21/1965 Referring Provider: Kristen Loader, FNP   Encounter Date: 09/01/2017  PT End of Session - 09/01/17 1156    Visit Number  8    Date for PT Re-Evaluation  10/03/17    PT Start Time  0383    PT Stop Time  0927    PT Time Calculation (min)  40 min    Activity Tolerance  Patient tolerated treatment well       Past Medical History:  Diagnosis Date  . HTN (hypertension)     History reviewed. No pertinent surgical history.  There were no vitals filed for this visit.  Subjective Assessment - 09/01/17 0847    Subjective  Things are going well.  I think the DN helps.  The pain is much better.      Currently in Pain?  Yes    Pain Score  2     Pain Location  Shoulder    Aggravating Factors   reaching out or up         [facility] PT Assessment - 09/01/17 0001      AROM   Right Shoulder Flexion  157 Degrees    Right Shoulder ABduction  150 Degrees    Right Shoulder Internal Rotation  --   T10                  [facility] Adult PT Treatment/Exercise - 09/01/17 0001      Lumbar Exercises: Quadruped   Other Quadruped Lumbar Exercises  foam roll lat stretch and thread the needle on right 5-6 reps       Shoulder Exercises: Prone   Extension  Strengthening;Right;10 reps    Extension Weight (lbs)  2    Horizontal ABduction 1  Strengthening;Right;10 reps;Weights    Horizontal ABduction 1 Weight (lbs)  2    Horizontal ABduction 2  Strengthening;Right;15 reps;Weights    Horizontal ABduction 2 Weight (lbs)  2      Shoulder Exercises: ROM/Strengthening   Ranger  L20 15x flexion       Shoulder Exercises: Stretch   Other Shoulder Stretches  doorway stretch for lats reaching up and over 3x 5      Manual Therapy    Joint Mobilization  shoulder distraction and mob with movement     Soft tissue mobilization  lacrosse ball trigger point release per HEP             PT Education - 09/01/17 0917    Education Details   Access Code: [email or web address] lat and subscapularis stretching;  prone Is, Ys, Ts    Person(s) Educated  Patient    Methods  Explanation;Demonstration;Handout    Comprehension  Returned demonstration;Verbalized understanding       PT Short Term Goals - 09/01/17 1201      PT SHORT TERM GOAL #1   Title  ind with initial HEP    Status  Achieved      PT SHORT TERM GOAL #2   Title  demonstrates ability to reach behind the back to T8 for improved reaching back    Baseline  T10    Time  4    Status  Partially Met        PT Long Term Goals - 09/01/17 1201  PT LONG TERM GOAL #1   Title  ind with advanced HEP    Time  8    Period  Weeks    Status  On-going      PT LONG TERM GOAL #2   Title  pt reports he can reach to grab a light object in the back seat of the car while sitting in the front seat due to improved soft tissue length and shoulder mobility    Time  8    Period  Weeks    Status  On-going      PT LONG TERM GOAL #3   Title  pt will be able to demonstrate right shoulder flexion and abduction at least 150 degrees for overhead reaching    Status  Achieved      PT LONG TERM GOAL #4   Title  FOTO < or = to 25% limited    Time  8    Period  Weeks    Status  On-going            Plan - 09/01/17 6701    Clinical Impression Statement  The patient has much improved shoulder ROM with flexion, abduction and internal rotation.  He continues to have tender points and decreased muscle length in lats and subscapularis muscles which limits him from reaching endrange of motion.  Muscle fatigue reported with scapular strengthening exercise.  He would benefit from 3-4 additional PT visits to address remaining deficits.      Rehab Potential  Good    PT Frequency  2x / week     PT Duration  8 weeks    PT Treatment/Interventions  ADLs/Self Care Home Management;Electrical Stimulation;Biofeedback;Cryotherapy;Moist Heat;Therapeutic activities;Therapeutic exercise;Neuromuscular re-education;Patient/family education;Taping;Dry needling;Passive range of motion;Manual techniques    PT Next Visit Plan  DN/STM to subscap, tricep, rhomboids, lats, child's pose to stretch lats, thoracic ext and rotation ROM, scap stability    PT Home Exercise Plan   Access Code: [email or web address]        Patient will benefit from skilled therapeutic intervention in order to improve the following deficits and impairments:  Pain, Postural dysfunction, Increased muscle spasms, Decreased strength, Decreased range of motion, Impaired UE functional use  Visit Diagnosis: Acute pain of right shoulder  Muscle weakness (generalized)  Abnormal posture     Problem List Patient Active Problem List   Diagnosis Date Noted  . Chest pain 12/13/2012  . Elevated lipids 12/13/2012  . HTN (hypertension) 12/13/2012   [name], PT 09/01/17 12:03 PM Phone: [phone] Fax: [phone]  [name] 09/01/2017, 12:02 PM  [facility] [address] [place], [place], [address] Phone: [phone]   Fax:  [phone]  Name: Bradley Mata MRN: [id number] Date of Birth: 23-May-1965

## 2017-09-01 NOTE — Patient Instructions (Addendum)
Access Code: [email or web address]  URL: [email or web address]  Date: 09/01/2017  Prepared by: Lavinia SharpsStacy Simpson   Exercises  Supine Shoulder External Rotation with Dowel - 5 reps - 1 sets - 10 sec hold - 2x daily - 7x weekly  Seated Cervical Sidebending Stretch - 5 reps - 1 sets - 10 sec hold - 2x daily - 7x weekly  Doorway Pec Stretch at 60 Degrees Abduction - 3 reps - 1 sets - 30 sec hold - 1x daily - 7x weekly  Seated Scapular Retraction - 10 reps - 1 sets - 1x daily - 7x weekly  Standing Shoulder Posterior Capsule Stretch - 3 reps - 1 sets - 20 hold - 1x daily - 7x weekly  Doorway Rhomboid Stretch - 20 reps - 1 sets - 1x daily - 7x weekly  Supine Shoulder Internal Rotation Stretch - 10 reps - 3 sets - 1x daily - 7x weekly  Standing Shoulder Internal Rotation Stretch with Towel - 10 reps - 1 sets - 1x daily - 7x weekly  Supine Shoulder External Rotation Stretch - 3 reps - 1 sets - 20 hold - 1x daily - 7x weekly  Supine Shoulder Flexion PROM - 3 reps - 1 sets - 20 hold - 1x daily - 7x weekly  Child's Pose with Thread the Needle - 10 reps - 1 sets - 1x daily - 7x weekly  Quadruped Full Range Thoracic Rotation with Reach - 10 reps - 1 sets - 1x daily - 7x weekly  Lacrosse Ball Subscapularis Release - 10 reps - 1 sets - 1x daily - 7x weekly  Prone Scapular Retraction - 10 reps - 1 sets - 1x daily - 7x weekly  Prone Shoulder Horizontal Abduction - 10 reps - 1 sets - 1x daily - 7x weekly  Prone Single Arm Shoulder Y - 10 reps - 1 sets - 1x daily - 7x weekly     Lavinia SharpsStacy Simpson PT [facility] [address], [address] [place], [place] [address] Phone # [phone] Fax [phone]

## 2017-09-15 ENCOUNTER — Encounter: Payer: Self-pay | Admitting: Physical Therapy

## 2017-09-15 ENCOUNTER — Ambulatory Visit: Payer: 59 | Attending: Family Medicine | Admitting: Physical Therapy

## 2017-09-15 DIAGNOSIS — M25511 Pain in right shoulder: Secondary | ICD-10-CM | POA: Diagnosis present

## 2017-09-15 DIAGNOSIS — R293 Abnormal posture: Secondary | ICD-10-CM

## 2017-09-15 DIAGNOSIS — M6281 Muscle weakness (generalized): Secondary | ICD-10-CM | POA: Diagnosis not present

## 2017-09-15 NOTE — Therapy (Signed)
[facility] [address] [place], [place], [address] Phone: [phone]   Fax:  [phone]  Physical Therapy Treatment  Patient Details  Name: AGRON SWINEY MRN: [id number] Date of Birth: [birth year] Referring Provider: Kristen Loader, FNP   Encounter Date: 09/15/2017  PT End of Session - 09/15/17 0902    Visit Number  9    Date for PT Re-Evaluation  10/03/17    PT Start Time  0847    PT Stop Time  0930    PT Time Calculation (min)  43 min    Activity Tolerance  Patient tolerated treatment well    Behavior During Therapy  [facility] for tasks assessed/performed       Past Medical History:  Diagnosis Date  . HTN (hypertension)     History reviewed. No pertinent surgical history.  There were no vitals filed for this visit.  Subjective Assessment - 09/15/17 0853    Subjective  I still have a lot of pain reaching down to get something on the floor in the car.  No pain currently.     Currently in Pain?  No/denies                       [facility] Adult PT Treatment/Exercise - 09/15/17 0001      Exercises   Exercises  Shoulder      Shoulder Exercises: Prone   Extension  Strengthening;Left;20 reps;Weights    Extension Weight (lbs)  4    Horizontal ABduction 1  Strengthening;Right;Weights;20 reps    Horizontal ABduction 1 Weight (lbs)  2    Horizontal ABduction 2  Strengthening;Right;Weights;20 reps    Horizontal ABduction 2 Weight (lbs)  2    Other Prone Exercises  row - 4lb - 20x      Shoulder Exercises: ROM/Strengthening   UBE (Upper Arm Bike)  L3 3x3 fwd/back      Manual Therapy   Joint Mobilization  shoulder distraction and mob with movement     Soft tissue mobilization  lats, subscap, rhomboids, upper trap on right side       Trigger Point Dry Needling - 09/15/17 1019    Consent Given?  Yes    Muscles Treated Upper Body  --   right lats   Upper Trapezius Response  Twitch reponse  elicited;Palpable increased muscle length    Rhomboids Response  Twitch response elicited;Palpable increased muscle length             PT Short Term Goals - 09/01/17 1201      PT SHORT TERM GOAL #1   Title  ind with initial HEP    Status  Achieved      PT SHORT TERM GOAL #2   Title  demonstrates ability to reach behind the back to T8 for improved reaching back    Baseline  T10    Time  4    Status  Partially Met        PT Long Term Goals - 09/01/17 1201      PT LONG TERM GOAL #1   Title  ind with advanced HEP    Time  8    Period  Weeks    Status  On-going      PT LONG TERM GOAL #2   Title  pt reports he can reach to grab a light object in the back seat of the car while sitting in the front seat due to improved  soft tissue length and shoulder mobility    Time  8    Period  Weeks    Status  On-going      PT LONG TERM GOAL #3   Title  pt will be able to demonstrate right shoulder flexion and abduction at least 150 degrees for overhead reaching    Status  Achieved      PT LONG TERM GOAL #4   Title  FOTO < or = to 25% limited    Time  8    Period  Weeks    Status  On-going            Plan - 09/15/17 0939    Clinical Impression Statement  Pt continues to have a lot of tension at subscapularis and lats on Rt side.  He was able to progress resistance of the scapula.  Continues to benefit from skilled PT to continue with POC    PT Treatment/Interventions  ADLs/Self Care Home Management;Electrical Stimulation;Biofeedback;Cryotherapy;Moist Heat;Therapeutic activities;Therapeutic exercise;Neuromuscular re-education;Patient/family education;Taping;Dry needling;Passive range of motion;Manual techniques    PT Next Visit Plan  DN/STM to subscap, tricep, rhomboids, lats, child's pose to stretch lats, thoracic ext and rotation ROM, scap stability    PT Home Exercise Plan   Access Code: [email or web address]     Consulted and Agree with Plan of Care  Patient       Patient will  benefit from skilled therapeutic intervention in order to improve the following deficits and impairments:  Pain, Postural dysfunction, Increased muscle spasms, Decreased strength, Decreased range of motion, Impaired UE functional use  Visit Diagnosis: No diagnosis found.     Problem List Patient Active Problem List   Diagnosis Date Noted  . Chest pain 12/13/2012  . Elevated lipids 12/13/2012  . HTN (hypertension) 12/13/2012    Zannie Cove, PT 09/15/2017, 10:21 AM  [facility] [address], [place], [place], [address] Phone: [phone]   Fax:  [phone]  Name: NOLE ROBEY MRN: [id number] Date of Birth: 04/30/1965

## 2017-09-23 ENCOUNTER — Ambulatory Visit: Payer: 59 | Admitting: Physical Therapy

## 2017-09-23 ENCOUNTER — Encounter: Payer: Self-pay | Admitting: Physical Therapy

## 2017-09-23 DIAGNOSIS — M6281 Muscle weakness (generalized): Secondary | ICD-10-CM

## 2017-09-23 DIAGNOSIS — M25511 Pain in right shoulder: Secondary | ICD-10-CM

## 2017-09-23 DIAGNOSIS — R293 Abnormal posture: Secondary | ICD-10-CM
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
## 2017-09-28 ENCOUNTER — Telehealth: Payer: Self-pay | Admitting: Physical Therapy

## 2017-09-28 ENCOUNTER — Ambulatory Visit: Payer: 59 | Admitting: Physical Therapy

## 2017-09-28 NOTE — Telephone Encounter (Signed)
Patient did not show for appointment.  Patient was called on both contact numbers given and both numbers invalid/disconnected  Vincente Poli, PT 09/28/17 8:18 AM

## 2017-10-05 ENCOUNTER — Encounter: Payer: Self-pay | Admitting: Physical Therapy

## 2017-10-05 ENCOUNTER — Ambulatory Visit: Payer: 59 | Attending: Family Medicine | Admitting: Physical Therapy

## 2017-10-05 DIAGNOSIS — M6281 Muscle weakness (generalized): Secondary | ICD-10-CM | POA: Diagnosis present

## 2017-10-05 DIAGNOSIS — R293 Abnormal posture: Secondary | ICD-10-CM

## 2017-10-05 DIAGNOSIS — M25511 Pain in right shoulder: Secondary | ICD-10-CM | POA: Insufficient documentation

## 2017-10-05 NOTE — Therapy (Signed)
[facility] [address] [place], [place], [address] Phone: [phone]   Fax:  [phone]  Physical Therapy Treatment  Patient Details  Name: Bradley Mata MRN: [id number] Date of Birth: 01-30-1965 Referring Provider (PT): Kristen Loader, FNP   Encounter Date: 10/05/2017  PT End of Session - 10/05/17 0810    Visit Number  11    Date for PT Re-Evaluation  10/03/17    PT Start Time  0802    PT Stop Time  0839   Dry needling   PT Time Calculation (min)  37 min    Activity Tolerance  Patient tolerated treatment well    Behavior During Therapy  [facility] for tasks assessed/performed       Past Medical History:  Diagnosis Date  . HTN (hypertension)     History reviewed. No pertinent surgical history.  There were no vitals filed for this visit.  Subjective Assessment - 10/05/17 0806    Subjective  No changes.  I feel 95% better overall.    Limitations  House hold activities;Lifting    Patient Stated Goals  be able to do normal activities    Currently in Pain?  No/denies                       [facility] Adult PT Treatment/Exercise - 10/05/17 0001      Shoulder Exercises: Standing   External Rotation  Strengthening;Right;Weights    External Rotation Weight (lbs)  20    Internal Rotation  Strengthening;20 reps;Weights    Internal Rotation Weight (lbs)  20      Shoulder Exercises: ROM/Strengthening   UBE (Upper Arm Bike)  L3 3x3 fwd/back   PT present    Other ROM/Strengthening Exercises  child's pose to stretch lats      Manual Therapy   Soft tissue mobilization  lats, subscap, rhomboids, upper trap on right side       Trigger Point Dry Needling - 10/05/17 0842    Consent Given?  Yes    Muscles Treated Upper Body  --   latissimus dorsi   Rhomboids Response  Twitch response elicited             PT Short Term Goals - 09/01/17 1201      PT SHORT TERM GOAL #1   Title  ind with  initial HEP    Status  Achieved      PT SHORT TERM GOAL #2   Title  demonstrates ability to reach behind the back to T8 for improved reaching back    Baseline  T10    Time  4    Status  Partially Met        PT Long Term Goals - 10/05/17 0816      PT LONG TERM GOAL #1   Title  ind with advanced HEP    Status  Achieved      PT LONG TERM GOAL #2   Title  pt reports he can reach to grab a light object in the back seat of the car while sitting in the front seat due to improved soft tissue length and shoulder mobility    Status  Not Met      PT LONG TERM GOAL #3   Title  pt will be able to demonstrate right shoulder flexion and abduction at least 150 degrees for overhead reaching    Status  Achieved      PT LONG  TERM GOAL #4   Title  FOTO < or = to 25% limited    Baseline  37%    Status  Not Met            Plan - 10/05/17 0817    Clinical Impression Statement  Pt has not met all goals but reports feeling 95% improved.  He is ind with advanced HEP.  Today's visit to review HEP and ensure able to continue progressing for last bit of AROM for internal     PT Treatment/Interventions  ADLs/Self Care Home Management;Electrical Stimulation;Biofeedback;Cryotherapy;Moist Heat;Therapeutic activities;Therapeutic exercise;Neuromuscular re-education;Patient/family education;Taping;Dry needling;Passive range of motion;Manual techniques    PT Next Visit Plan  d/c    PT Home Exercise Plan   Access Code: [email or web address]     Consulted and Agree with Plan of Care  Patient       Patient will benefit from skilled therapeutic intervention in order to improve the following deficits and impairments:  Pain, Postural dysfunction, Increased muscle spasms, Decreased strength, Decreased range of motion, Impaired UE functional use  Visit Diagnosis: Acute pain of right shoulder  Muscle weakness (generalized)  Abnormal posture     Problem List Patient Active Problem List   Diagnosis Date Noted  .  Chest pain 12/13/2012  . Elevated lipids 12/13/2012  . HTN (hypertension) 12/13/2012    Zannie Cove, PT 10/05/2017, 8:43 AM  [facility] [address] [place], [place], [address] Phone: [phone]   Fax:  [phone]  Name: Bradley Mata MRN: [id number] Date of Birth: 06/19/1965  PHYSICAL THERAPY DISCHARGE SUMMARY  Visits from Start of Care: 11  Current functional level related to goals / functional outcomes: See above   Remaining deficits: See above   Education / Equipment: See HEP  Plan: Patient agrees to discharge.  Patient goals were partially met. Patient is being discharged due to being pleased with the current functional level.  ?????    [employer], PT 10/05/17 8:44 AM

## 2017-12-30 ENCOUNTER — Other Ambulatory Visit: Payer: Self-pay | Admitting: Cardiovascular Disease

## 2018-01-28 ENCOUNTER — Other Ambulatory Visit: Payer: Self-pay | Admitting: Cardiovascular Disease

## 2018-03-10 ENCOUNTER — Other Ambulatory Visit: Payer: Self-pay | Admitting: *Deleted

## 2018-03-10 ENCOUNTER — Telehealth: Payer: Self-pay | Admitting: Cardiovascular Disease

## 2018-03-10 MED ORDER — LOSARTAN POTASSIUM 25 MG PO TABS: 25.0000 mg | ORAL_TABLET | Freq: Every day | ORAL | 0 refills | Status: DC

## 2018-03-10 NOTE — Telephone Encounter (Signed)
New Message    *STAT* If patient is at the pharmacy, call can be transferred to refill team.   1. Which medications need to be refilled? (please list name of each medication and dose if known) Losartan 25mg    2. Which pharmacy/location (including street and city if local pharmacy) is medication to be sent to? [facility] [address]   3. Do they need a 30 day or 90 day supply? 30 day supply  Patient out of medication and has appointment on 04/04/18 @ 8am.

## 2018-03-24 ENCOUNTER — Encounter: Payer: Self-pay | Admitting: Nurse Practitioner

## 2018-03-29 ENCOUNTER — Other Ambulatory Visit: Payer: Self-pay | Admitting: Nurse Practitioner

## 2018-03-29 ENCOUNTER — Telehealth: Payer: Self-pay | Admitting: *Deleted

## 2018-03-29 MED ORDER — LOSARTAN POTASSIUM 25 MG PO TABS: 25.0000 mg | ORAL_TABLET | Freq: Every day | ORAL | 1 refills | Status: DC

## 2018-03-29 NOTE — Telephone Encounter (Signed)
Called pt re: appt with Norma Fredrickson, NP that was cancelled due to the Covid 19. Left a detailed message that we have set up his new appt 07/04/2018 @ 8:30 and to contact the office if this wasn't good for him.

## 2018-03-29 NOTE — Progress Notes (Signed)
   Primary Cardiologist:  Eden Emms  Patient contacted.  History reviewed.  No symptoms to suggest any unstable cardiac conditions.  Based on discussion, with current pandemic situation, we will be postponing this appointment for [name] with a plan for f/u in May/June or sooner if feasible/necessary. His Losartan has been refilled today.  If symptoms change, he has been instructed to contact our office.   Norma Fredrickson, NP  03/29/2018 7:48 AM         .

## 2018-04-04 ENCOUNTER — Ambulatory Visit: Payer: 59 | Admitting: Nurse Practitioner

## 2018-06-29 ENCOUNTER — Telehealth: Payer: Self-pay

## 2018-06-29 NOTE — Telephone Encounter (Signed)
    COVID-19 Pre-Screening Questions:  . In the past 7 to 10 days have you had a cough,  shortness of breath, headache, congestion, fever (100 or greater) body aches, chills, sore throat, or sudden loss of taste or sense of smell? . Have you been around anyone with known Covid 19. . Have you been around anyone who is awaiting Covid 19 test results in the past 7 to 10 days? . Have you been around anyone who has been exposed to Covid 19, or has mentioned symptoms of Covid 19 within the past 7 to 10 days?  If you have any concerns/questions about symptoms patients report during screening (either on the phone or at threshold). Contact the provider seeing the patient or DOD for further guidance.  If neither are available contact a member of the leadership team.    Patient answered no to questions 1, 3, and 4. Patient answered yes to number 2. He states that 3 weeks ago his coworker had symptoms and tested positive. They both had on masks and he was around the coworker for less than 15 minutes. Patient was tested and the results came back negative.

## 2018-07-03 NOTE — Progress Notes (Signed)
CARDIOLOGY OFFICE NOTE  Date:  07/04/2018    Pricilla Larsson Date of Birth: 03-17-1965 Medical Record #[id number]  PCP:  Hulan Fess, MD  Cardiologist:  Johnsie Cancel    Chief Complaint  Patient presents with  . Hypertension  . Follow-up    1 1/2 year check - seen for Dr. Johnsie Cancel    History of Present Illness: Bradley Mata is a 53 y.o. male who presents today for a follow up visit. Seen for Dr. Johnsie Cancel.   He has a history of HTN and atypical chest pain - remote normal stress echo in 2014. He smokes - likes cigars.   Last seen in February of 2019 by Dr. Johnsie Cancel. EKG abnormal - stress echo was ordered but the patient did not show - thus not done.   The patient does not have symptoms concerning for COVID-19 infection (fever, chills, cough, or new shortness of breath).   Comes in today. Here alone. He has done ok over the past year and is trying to stay active. BP looks ok. He tries to watch his salt - has "slipped up" recently - went back to [place] but is planning on stopping that. He has had a colleague "drop over dead" - presumed MI. He is worried about this. No active chest pain that he has had.Not short of breath. Exercises sporadically.  He is having labs tomorrow for his PCP. Not on statin. No aspirin. Still smoking cigars. Needs Losartan refilled. He has been trying to lose weight.    Past Medical History:  Diagnosis Date  . HTN (hypertension)     History reviewed. No pertinent surgical history.   Medications: Current Meds  Medication Sig  . losartan (COZAAR) 25 MG tablet Take 1 tablet (25 mg total) by mouth daily. Please keep upcoming appointment for further refills  . Multiple Vitamins-Minerals (MULTIVITAMIN ADULT PO) Take 2 tablets by mouth daily.     Allergies: No Known Allergies  Social History: The patient  reports that he has quit smoking. He has never used smokeless tobacco. He reports that he does not drink alcohol or use drugs.   Family  History: The patient's family history includes Cancer in his mother.   Review of Systems: Please see the history of present illness.   All other systems are reviewed and negative.   Physical Exam: VS:  BP 128/86   Pulse 87   Ht 5\' 9"  (1.753 m)   Wt 187 lb 6.4 oz (85 kg)   SpO2 97%   BMI 27.67 kg/m  .  BMI Body mass index is 27.67 kg/m.  Wt Readings from Last 3 Encounters:  07/04/18 187 lb 6.4 oz (85 kg)  02/01/17 194 lb 8 oz (88.2 kg)  11/25/15 192 lb 12.8 oz (87.5 kg)    General: Pleasant. Well developed, well nourished and in no acute distress.   HEENT: Normal.  Neck: Supple, no JVD, carotid bruits, or masses noted.  Cardiac: Regular rate and rhythm. No murmurs, rubs, or gallops. No edema.  Respiratory:  Lungs are clear to auscultation bilaterally with normal work of breathing.  GI: Soft and nontender.  MS: No deformity or atrophy. Gait and ROM intact.  Skin: Warm and dry. Color is normal.  Neuro:  Strength and sensation are intact and no gross focal deficits noted.  Psych: Alert, appropriate and with normal affect.   LABORATORY DATA:  EKG:  EKG is ordered today. This demonstrates NSR with inferolateral T wave changes. HR is 87.  No acute changes.   No results found for: WBC, HGB, HCT, PLT, GLUCOSE, CHOL, TRIG, HDL, LDLDIRECT, LDLCALC, ALT, AST, NA, K, CL, CREATININE, BUN, CO2, TSH, PSA, INR, GLUF, HGBA1C, MICROALBUR     BNP (last 3 results) No results for input(s): BNP in the last 8760 hours.  ProBNP (last 3 results) No results for input(s): PROBNP in the last 8760 hours.   Other Studies Reviewed Today:  Stress Echo Study Conclusions from 12/2012  - Stress ECG conclusions: The stress ECG was normal. - Staged echo: Normal echo stress Impressions:  - No evidence for ischemia. Hypertensive BP response.  Assessment/Plan:  1. HTN - BP looks fine here today - no changes made - ARB refilled today.   2. History of atypical chest pain - denies this at this  time - but has chronically abnormal EKG/HTN and smoking history - he is worried about his risk given recent friend's sudden death. Not able to do exercise testing due to COVID - will arrange for coronary CT. BMET today. Getting lipids checked by PCP later this week - he will be sending us for review. Will need beta blocker prior to study.   3. Abnormal EKG - see #2  4. Cigar use - total cessation encouraged.   5. COVID-19 Education: The signs and symptoms of COVID-19 were discussed with the patient and how to seek care for testing (follow up with PCP or arrange E-visit).  The importance of social distancing, staying at home, hand hygiene and wearing a mask when out in public were discussed today.  Current medicines are reviewed with the patient today.  The patient does not have concerns regarding medicines other than what has been noted above.  The following changes have been made:  See above.  Labs/ tests ordered today include:    Orders Placed This Encounter  Procedures  . EKG 12-Lead     Disposition:   Tentative follow up with Dr. Eden EmmsNishan in one year.   Patient is agreeable to this plan and will call if any problems develop in the interim.   SignedNorma Fredrickson: Marzell Isakson, NP  07/04/2018 8:53 AM  [facility] [address] [place], [place]  [address] Phone: [phone] Fax: [phone]

## 2018-07-04 ENCOUNTER — Other Ambulatory Visit: Payer: Self-pay

## 2018-07-04 ENCOUNTER — Ambulatory Visit (INDEPENDENT_AMBULATORY_CARE_PROVIDER_SITE_OTHER): Payer: 59 | Admitting: Nurse Practitioner

## 2018-07-04 ENCOUNTER — Encounter: Payer: Self-pay | Admitting: Nurse Practitioner

## 2018-07-04 VITALS — BP 128/86 | HR 87 | Ht 69.0 in | Wt 187.4 lb

## 2018-07-04 DIAGNOSIS — I10 Essential (primary) hypertension: Secondary | ICD-10-CM | POA: Diagnosis not present

## 2018-07-04 DIAGNOSIS — R9431 Abnormal electrocardiogram [ECG] [EKG]: Secondary | ICD-10-CM

## 2018-07-04 DIAGNOSIS — Z87891 Personal history of nicotine dependence: Secondary | ICD-10-CM | POA: Diagnosis not present

## 2018-07-04 LAB — BASIC METABOLIC PANEL
BUN/Creatinine Ratio: 10 (ref 9–20)
BUN: 12 mg/dL (ref 6–24)
CO2: 22 mmol/L (ref 20–29)
Calcium: 9.5 mg/dL (ref 8.7–10.2)
Chloride: 99 mmol/L (ref 96–106)
Creatinine, Ser: 1.25 mg/dL (ref 0.76–1.27)
GFR calc Af Amer: 76 mL/min/{1.73_m2} (ref >59)
GFR calc non Af Amer: 65 mL/min/{1.73_m2} (ref >59)
Glucose: 107 mg/dL — ABNORMAL HIGH (ref 65–99)
Potassium: 4.4 mmol/L (ref 3.5–5.2)
Sodium: 138 mmol/L (ref 134–144)

## 2018-07-04 MED ORDER — METOPROLOL TARTRATE 50 MG PO TABS: 50.0000 mg | ORAL_TABLET | ORAL | 0 refills | Status: DC

## 2018-07-04 MED ORDER — LOSARTAN POTASSIUM 25 MG PO TABS: 25.0000 mg | ORAL_TABLET | Freq: Every day | ORAL | 3 refills | Status: DC

## 2018-07-04 NOTE — Patient Instructions (Addendum)
After Visit Summary:  We will be checking the following labs today - BMET   Medication Instructions:    Continue with your current medicines.   I sent in your refill for Losartan today.   I am going to give you a RX for Lopressor to take 2 hours prior to your scan - bring the 2nd dose with you on the day of your scan.    If you need a refill on your cardiac medications before your next appointment, please call your pharmacy.     Testing/Procedures To Be Arranged:  Coronary CT  Please arrive at the [facility] main entrance of [facility] at xx:xx AM (30-45 minutes prior to test start time)  [facility] [place], [place] [address] [phone]  Proceed to the [facility] Radiology Department (First Floor).  Please follow these instructions carefully (unless otherwise directed):  Hold all erectile dysfunction medications at least 48 hours prior to test.  On the Night Before the Test: . Be sure to Drink plenty of water. . Do not consume any caffeinated/decaffeinated beverages or chocolate 12 hours prior to your test. . Do not take any antihistamines 12 hours prior to your test. .   On the Day of the Test: . Drink plenty of water. Do not drink any water within one hour of the test. . Do not eat any food 4 hours prior to the test. . You may take your regular medications prior to the test.  . Take metoprolol (Lopressor) two hours prior to test.       After the Test: . Drink plenty of water. . After receiving IV contrast, you may experience a mild flushed feeling. This is normal. . On occasion, you may experience a mild rash up to 24 hours after the test. This is not dangerous. If this occurs, you can take Benadryl 25 mg and increase your fluid intake. . If you experience trouble breathing, this can be serious. If it is severe call 911 IMMEDIATELY. If it is mild, please call our office. . If you take any of these medications:  Glipizide/Metformin, Avandament, Glucavance, please do not take 48 hours after completing test.   Follow-Up:   See [place] back tentatively in one year.     At [facility], you and your health needs are our priority.  As part of our continuing mission to provide you with exceptional heart care, we have created designated Provider Care Teams.  These Care Teams include your primary Cardiologist (physician) and Advanced Practice Providers (APPs -  Physician Assistants and Nurse Practitioners) who all work together to provide you with the care you need, when you need it.  Special Instructions:  . Stay safe, stay home, wash your hands for at least 20 seconds and wear a mask when out in public.  . It was good to talk with you today.    Call the [place] office at [phone] if you have any questions, problems or concerns.

## 2018-07-11 NOTE — Progress Notes (Signed)
Pt has been made aware of normal result and verbalized understanding.  jw jw 07/11/2018

## 2018-07-24 ENCOUNTER — Telehealth (HOSPITAL_COMMUNITY): Payer: Self-pay | Admitting: Emergency Medicine

## 2018-07-24 NOTE — Telephone Encounter (Signed)
Reaching out to patient to offer assistance regarding upcoming cardiac imaging study; pt verbalizes understanding of appt date/time, parking situation and where to check in, pre-test NPO status and medications ordered, and verified current allergies; name and call back number provided for further questions should they arise Tashiba Timoney RN Navigator Cardiac Imaging [facility] [phone] office [phone] cell  Pt denies covid symptoms, verbalized understanding of visitor policy. 

## 2018-07-25 ENCOUNTER — Ambulatory Visit (HOSPITAL_COMMUNITY)
Admission: RE | Admit: 2018-07-25 | Discharge: 2018-07-25 | Disposition: A | Discharge status: Home / Self Care | Payer: 59 | Source: Ambulatory Visit | Attending: Nurse Practitioner | Admitting: Nurse Practitioner

## 2018-07-25 ENCOUNTER — Other Ambulatory Visit: Payer: Self-pay

## 2018-07-25 ENCOUNTER — Ambulatory Visit (HOSPITAL_COMMUNITY): Payer: 59

## 2018-07-25 DIAGNOSIS — I10 Essential (primary) hypertension: Secondary | ICD-10-CM

## 2018-07-25 DIAGNOSIS — Z87891 Personal history of nicotine dependence: Secondary | ICD-10-CM | POA: Insufficient documentation

## 2018-07-25 DIAGNOSIS — R9431 Abnormal electrocardiogram [ECG] [EKG]: Secondary | ICD-10-CM | POA: Insufficient documentation

## 2018-07-25 MED ORDER — METOPROLOL TARTRATE 5 MG/5ML IV SOLN
INTRAVENOUS | Status: AC
  Filled 2018-07-25: qty 10

## 2018-07-25 MED ORDER — METOPROLOL TARTRATE 5 MG/5ML IV SOLN
INTRAVENOUS | Status: AC
  Filled 2018-07-25: qty 5

## 2018-07-25 MED ORDER — DILTIAZEM HCL 25 MG/5ML IV SOLN
5.0000 mg | Freq: Once | INTRAVENOUS | Status: AC
  Administered 2018-07-25: 5 mg via INTRAVENOUS
  Filled 2018-07-25 (×2): qty 5

## 2018-07-25 MED ORDER — IOHEXOL 350 MG/ML SOLN
80.0000 mL | Freq: Once | INTRAVENOUS | Status: AC | PRN
  Administered 2018-07-25: 80 mL via INTRAVENOUS

## 2018-07-25 MED ORDER — NITROGLYCERIN 0.4 MG SL SUBL
0.8000 mg | SUBLINGUAL_TABLET | Freq: Once | SUBLINGUAL | Status: AC
  Administered 2018-07-25: 0.8 mg via SUBLINGUAL
  Filled 2018-07-25: qty 25

## 2018-07-25 MED ORDER — DILTIAZEM HCL 25 MG/5ML IV SOLN
5.0000 mg | Freq: Once | INTRAVENOUS | Status: AC
  Administered 2018-07-25: 5 mg via INTRAVENOUS
  Filled 2018-07-25: qty 5

## 2018-07-25 MED ORDER — METOPROLOL TARTRATE 5 MG/5ML IV SOLN
5.0000 mg | INTRAVENOUS | Status: DC | PRN
  Administered 2018-07-25 (×4): 5 mg via INTRAVENOUS
  Filled 2018-07-25 (×4): qty 5

## 2018-07-25 MED ORDER — NITROGLYCERIN 0.4 MG SL SUBL
SUBLINGUAL_TABLET | SUBLINGUAL | Status: AC
  Filled 2018-07-25: qty 2

## 2018-07-25 NOTE — Progress Notes (Signed)
CT scan completed. Tolerated well. D/C home walking. Awake and alert. In no distress. 

## 2019-08-27 NOTE — Progress Notes (Signed)
CARDIOLOGY OFFICE NOTE  Date:  09/05/2019    Bradley Mata Date of Birth: Oct 15, 1965 Medical Record #[id number]  PCP:  Catha Gosselin, MD  Cardiologist:  Townsend Roger  Chief Complaint  Patient presents with  . Follow-up    Seen for Dr. Eden Emms    History of Present Illness: Bradley Mata is a 54 y.o. male who presents today for a 15 month check. Seen for Dr. Eden Emms.   He has a history of HTN and atypical chest pain - remote normal stress echo in 2014. He smokes - likes cigars. He has chronically abnormal EKG.   Last seen in February of 2019 by Dr. Eden Emms. EKG abnormal - stress echo was ordered but the patient did not show - thus not done.   I saw him June of 2020 - had been doing ok but still smoking. No statin. No aspirin therapy. No chest pain. He had had a friend "drop over dead". He was interested in coronary CT. Mild CAD with soft plaque noted - also noted that HR could not get below 77 with that study. Medical management was to continue.   Comes in today. Here alone. He feels good. Admits he is being lazy. No chest pain. Breathing is good. Smokes cigars on occasion. No recent labs. Only on Losartan. Does not really check his BP much at home.   Past Medical History:  Diagnosis Date  . CAD (coronary artery disease)   . HTN (hypertension)   . Tobacco abuse     History reviewed. No pertinent surgical history.   Medications: Current Meds  Medication Sig  . losartan (COZAAR) 25 MG tablet Take 1 tablet (25 mg total) by mouth daily.  . Multiple Vitamins-Minerals (MULTIVITAMIN ADULT PO) Take 2 tablets by mouth daily.  . [DISCONTINUED] losartan (COZAAR) 25 MG tablet Take 1 tablet (25 mg total) by mouth daily.     Allergies: No Known Allergies  Social History: The patient  reports that he has quit smoking. He has never used smokeless tobacco. He reports that he does not drink alcohol and does not use drugs.   Family History: The patient's family  history includes Cancer in his mother.   Review of Systems: Please see the history of present illness.   All other systems are reviewed and negative.   Physical Exam: VS:  BP (!) 130/96   Pulse 86   Ht 5\' 9"  (1.753 m)   Wt 189 lb (85.7 kg)   SpO2 98%   BMI 27.91 kg/m  .  BMI Body mass index is 27.91 kg/m.  Wt Readings from Last 3 Encounters:  09/05/19 189 lb (85.7 kg)  07/04/18 187 lb 6.4 oz (85 kg)  02/01/17 194 lb 8 oz (88.2 kg)   Recheck of his BP is 124/84 by me.  General: Pleasant. Well developed, well nourished and in no acute distress.   HEENT: Normal.  Neck: Supple, no JVD, carotid bruits, or masses noted.  Cardiac: Regular rate and rhythm. No murmurs, rubs, or gallops. No edema.  Respiratory:  Lungs are clear to auscultation bilaterally with normal work of breathing.  GI: Soft and nontender.  MS: No deformity or atrophy. Gait and ROM intact.  Skin: Warm and dry. Color is normal.  Neuro:  Strength and sensation are intact and no gross focal deficits noted.  Psych: Alert, appropriate and with normal affect.   LABORATORY DATA:  EKG:  EKG is ordered today.  Personally reviewed by me. This  demonstrates sinus rhythm with chronic T wave inversion - Hr is 86. This is unchanged.   Lab Results  Component Value Date   GLUCOSE 107 (H) 07/04/2018   NA 138 07/04/2018   K 4.4 07/04/2018   CL 99 07/04/2018   CREATININE 1.25 07/04/2018   BUN 12 07/04/2018   CO2 22 07/04/2018       BNP (last 3 results) No results for input(s): BNP in the last 8760 hours.  ProBNP (last 3 results) No results for input(s): PROBNP in the last 8760 hours.   Other Studies Reviewed Today:  CORONARY CT 07/2018  IMPRESSION: 1.  Calcium score 0  2. Essentially normal right dominant coronary arteries Only 1-24% soft plaque in mid LAD CAD RADS 1  3.  Normal aortic root 3.2 cm  Note we could not get patients HR under 70 despite above meds. Consider alternative testing in  future  Charlton Haws   Stress Echo Study Conclusions from 12/2012  - Stress ECG conclusions: The stress ECG was normal. - Staged echo: Normal echo stress Impressions:  - No evidence for ischemia. Hypertensive BP response.  Assessment/Plan:  1. HTN - BP recheck by me is fine - would continue with Losartan - this was refilled today.   2. CAD - very mild by coronary CT - needs aggressive risk factor modification - would advise baby aspirin. Needs to get back to routine exercise. Checking labs today - most likely needs statin.   3. Atypical chest pain - resolved.   4. Chronically abnormal EKG.   5. Cigar use - total cessation encouraged.   Current medicines are reviewed with the patient today.  The patient does not have concerns regarding medicines other than what has been noted above.  The following changes have been made:  See above.  Labs/ tests ordered today include:    Orders Placed This Encounter  Procedures  . Basic metabolic panel  . CBC  . Hepatic function panel  . Lipid panel  . EKG 12-Lead     Disposition:   FU with Dr. Eden Emms in one year.     Patient is agreeable to this plan and will call if any problems develop in the interim.   SignedNorma Fredrickson, NP  09/05/2019 10:57 AM  [facility] [address] [place], [place]  [address] Phone: [phone] Fax: [phone]

## 2019-09-05 ENCOUNTER — Other Ambulatory Visit: Payer: Self-pay

## 2019-09-05 ENCOUNTER — Encounter: Payer: Self-pay | Admitting: Nurse Practitioner

## 2019-09-05 ENCOUNTER — Ambulatory Visit: Payer: BC Managed Care – PPO | Admitting: Nurse Practitioner

## 2019-09-05 VITALS — BP 130/96 | HR 86 | Ht 69.0 in | Wt 189.0 lb

## 2019-09-05 DIAGNOSIS — I10 Essential (primary) hypertension: Secondary | ICD-10-CM

## 2019-09-05 DIAGNOSIS — I2510 Atherosclerotic heart disease of native coronary artery without angina pectoris: Secondary | ICD-10-CM | POA: Diagnosis not present

## 2019-09-05 DIAGNOSIS — I2583 Coronary atherosclerosis due to lipid rich plaque: Secondary | ICD-10-CM

## 2019-09-05 DIAGNOSIS — Z87891 Personal history of nicotine dependence: Secondary | ICD-10-CM | POA: Diagnosis not present

## 2019-09-05 DIAGNOSIS — R9431 Abnormal electrocardiogram [ECG] [EKG]: Secondary | ICD-10-CM

## 2019-09-05 LAB — CBC
Hematocrit: 39.9 % (ref 37.5–51.0)
Hemoglobin: 14.2 g/dL (ref 13.0–17.7)
MCH: 31.3 pg (ref 26.6–33.0)
MCHC: 35.6 g/dL (ref 31.5–35.7)
MCV: 88 fL (ref 79–97)
Platelets: 230 10*3/uL (ref 150–450)
RBC: 4.54 x10E6/uL (ref 4.14–5.80)
RDW: 12.7 % (ref 11.6–15.4)
WBC: 5 10*3/uL (ref 3.4–10.8)

## 2019-09-05 LAB — BASIC METABOLIC PANEL
BUN/Creatinine Ratio: 11 (ref 9–20)
BUN: 13 mg/dL (ref 6–24)
CO2: 23 mmol/L (ref 20–29)
Calcium: 9.3 mg/dL (ref 8.7–10.2)
Chloride: 100 mmol/L (ref 96–106)
Creatinine, Ser: 1.19 mg/dL (ref 0.76–1.27)
GFR calc Af Amer: 80 mL/min/{1.73_m2} (ref >59)
GFR calc non Af Amer: 69 mL/min/{1.73_m2} (ref >59)
Glucose: 109 mg/dL — ABNORMAL HIGH (ref 65–99)
Potassium: 4.2 mmol/L (ref 3.5–5.2)
Sodium: 137 mmol/L (ref 134–144)

## 2019-09-05 LAB — HEPATIC FUNCTION PANEL
ALT: 17 IU/L (ref 0–44)
AST: 22 IU/L (ref 0–40)
Albumin: 4.5 g/dL (ref 3.8–4.9)
Alkaline Phosphatase: 38 IU/L — ABNORMAL LOW (ref 48–121)
Bilirubin Total: 0.5 mg/dL (ref 0.0–1.2)
Bilirubin, Direct: 0.15 mg/dL (ref 0.00–0.40)
Total Protein: 7.4 g/dL (ref 6.0–8.5)

## 2019-09-05 LAB — LIPID PANEL
Chol/HDL Ratio: 4.7 ratio (ref 0.0–5.0)
Cholesterol, Total: 178 mg/dL (ref 100–199)
HDL: 38 mg/dL — ABNORMAL LOW (ref >39)
LDL Chol Calc (NIH): 116 mg/dL — ABNORMAL HIGH (ref 0–99)
Triglycerides: 134 mg/dL (ref 0–149)
VLDL Cholesterol Cal: 24 mg/dL (ref 5–40)

## 2019-09-05 MED ORDER — LOSARTAN POTASSIUM 25 MG PO TABS: 25.0000 mg | ORAL_TABLET | Freq: Every day | ORAL | 3 refills | Status: DC

## 2019-09-05 MED ORDER — ASPIRIN EC 81 MG PO TBEC: 81.0000 mg | DELAYED_RELEASE_TABLET | Freq: Every day | ORAL | 3 refills | Status: AC

## 2019-09-05 NOTE — Patient Instructions (Addendum)
After Visit Summary:  We will be checking the following labs today - BMET, CBC, HPF and Lipids   Medication Instructions:    Continue with your current medicines.   I did refill the Losartan  Baby aspirin one a day is recommended.    If you need a refill on your cardiac medications before your next appointment, please call your pharmacy.     Testing/Procedures To Be Arranged:  N/A  Follow-Up:   See Dr.Nishan in one year.     At [facility], you and your health needs are our priority.  As part of our continuing mission to provide you with exceptional heart care, we have created designated Provider Care Teams.  These Care Teams include your primary Cardiologist (physician) and Advanced Practice Providers (APPs -  Physician Assistants and Nurse Practitioners) who all work together to provide you with the care you need, when you need it.  Special Instructions:   Stay safe, wash your hands for at least 20 seconds and wear a mask when needed.   It was good to talk with you today.   Remember what we talked about today.    Call the [facility] office at [phone] if you have any questions, problems or concerns.

## 2019-09-06 DIAGNOSIS — Z202 Contact with and (suspected) exposure to infections with a predominantly sexual mode of transmission: Secondary | ICD-10-CM | POA: Diagnosis not present

## 2019-09-06 DIAGNOSIS — E782 Mixed hyperlipidemia: Secondary | ICD-10-CM | POA: Diagnosis not present

## 2019-09-06 DIAGNOSIS — Z0000 Encounter for general adult medical examination without abnormal findings: Secondary | ICD-10-CM | POA: Diagnosis not present

## 2019-09-06 DIAGNOSIS — Z23 Encounter for immunization: Secondary | ICD-10-CM | POA: Diagnosis not present

## 2019-09-06 DIAGNOSIS — N529 Male erectile dysfunction, unspecified: Secondary | ICD-10-CM | POA: Diagnosis not present

## 2019-09-06 DIAGNOSIS — I10 Essential (primary) hypertension: Secondary | ICD-10-CM | POA: Diagnosis not present

## 2019-09-06 DIAGNOSIS — Z125 Encounter for screening for malignant neoplasm of prostate: Secondary | ICD-10-CM | POA: Diagnosis not present

## 2020-01-11 DIAGNOSIS — Z1159 Encounter for screening for other viral diseases: Secondary | ICD-10-CM | POA: Diagnosis not present

## 2020-06-01 IMAGING — CT CT HEAR MORPH WITH CTA COR WITH SCORE WITH CA WITH CONTRAST AND
4 of 7 series · 8 of 20 positions shown, 9 images · non-contrast
Comparison: None.

Addendum:
CLINICAL DATA: Chest pain

EXAM:
Cardiac CTA
MEDICATIONS:
Sub lingual nitro 0.8 mg and lopressor 20mg Cardizem 10 mg
TECHNIQUE: The patient was scanned on a Siemens Force [REDACTED]ice scanner. Gantry
rotation speed was 250 msecs. Collimation was .6 mm. A 100 kV
prospective scan was triggered in the ascending thoracic aorta at
140 HU's Full mA was used between 35% and 75% of the R-R interval.
Average HR during the scan was 73 bpm. The 3D data set was
interpreted on a dedicated work station using MPR, MIP and VRT
modes. A total of 80 cc of contrast was used.

[Series 7: best diast 76 % · axial · 0.33mm/px · z∈[+1316,+1366]mm · 2 of 378 slices shown, 3 images]
[im 126/378  vessel]
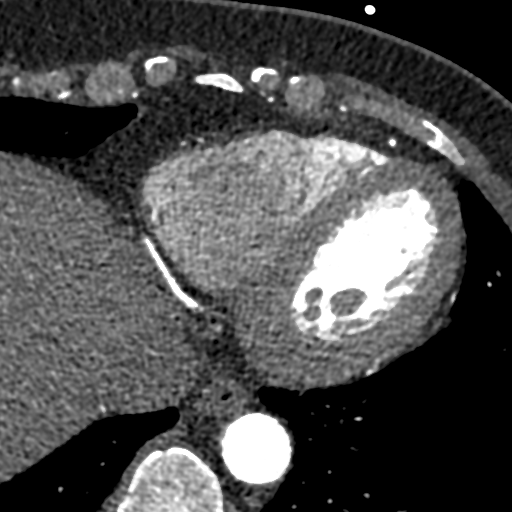
[im 126/378  lung]
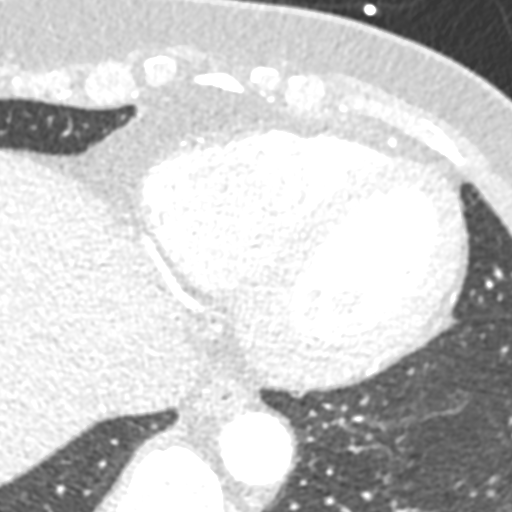
[im 252/378  vessel]
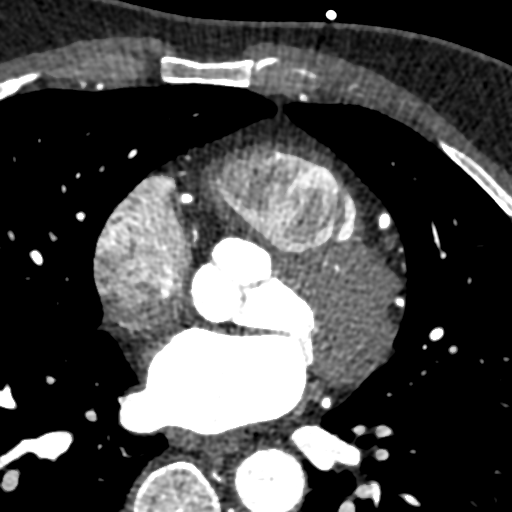

[Series 8: best syst 42 % · axial · 0.33mm/px · z∈[+1316,+1366]mm · 2 of 378 slices shown]
[im 126/378  vessel]
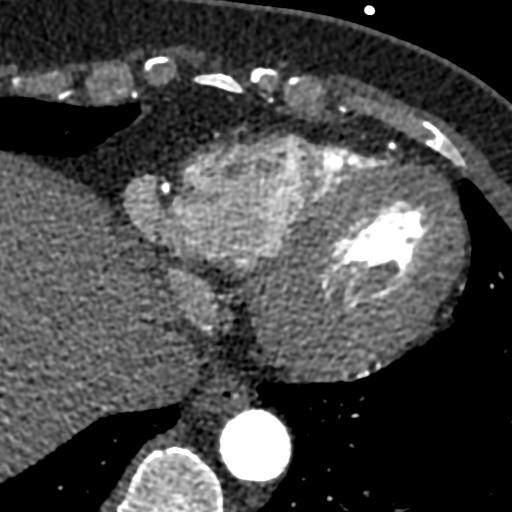
[im 252/378  vessel]
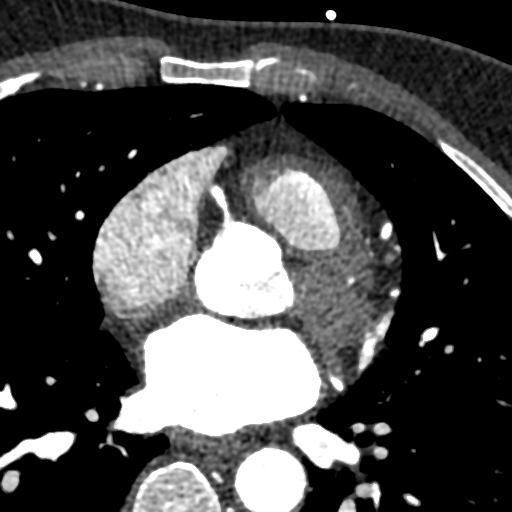

[Series 9: ts diast sharp 76 % · axial · 0.33mm/px · z∈[+1316,+1366]mm · 2 of 378 slices shown]
[im 126/378  lung]
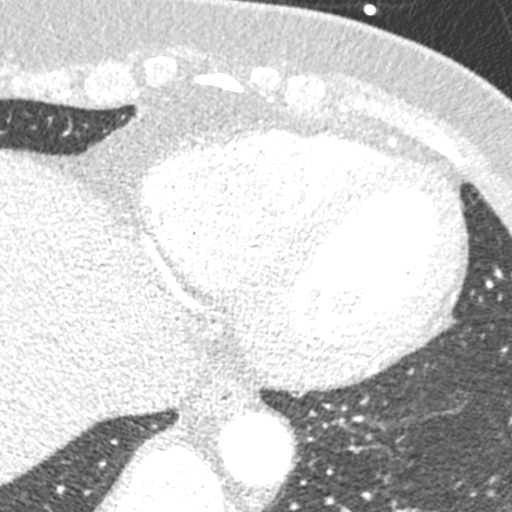
[im 252/378  lung]
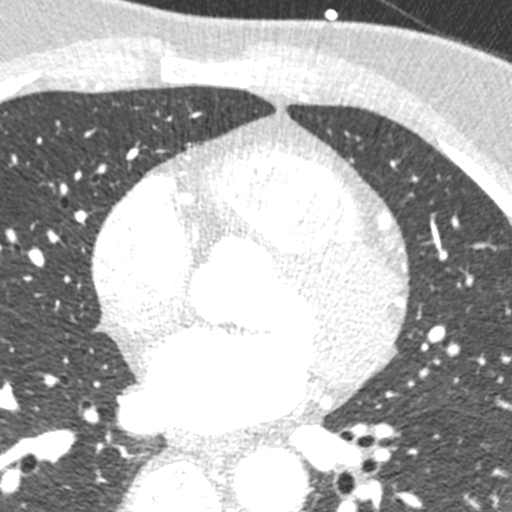

[Series 10: ts syst sharp 42 % · axial · 0.33mm/px · z∈[+1316,+1366]mm · 2 of 378 slices shown]
[im 126/378  lung]
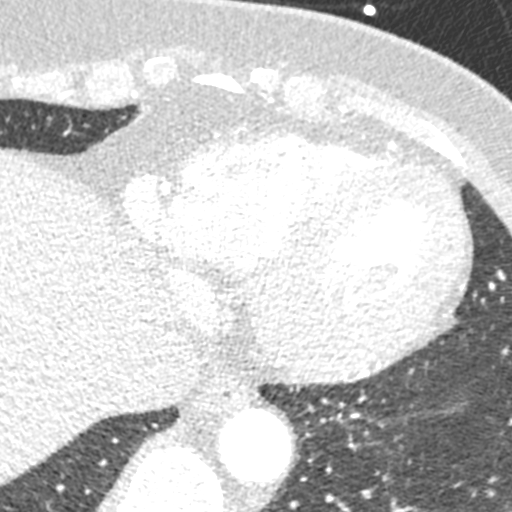
[im 252/378  lung]
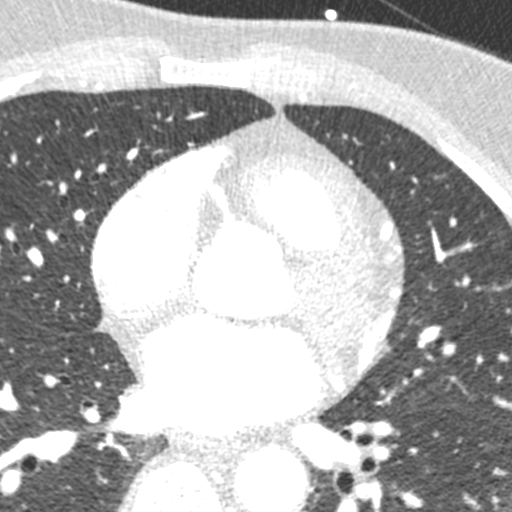

[8 of 20 positions shown; findings below may reference images not displayed]

FINDINGS: Non-cardiac: See separate report from [REDACTED]. No
significant findings on limited lung and soft tissue windows.

Calcium Score: No calcium noted

Coronary Arteries: Right dominant with no anomalies

LM: Normal

LAD: 1-24% soft plaque in mid LAD

D1: normal

D2: Normal

D3: Normal

D4: Normal

Circumflex: Normal

OM1: Normal

OM2: Normal

RCA: Normal

PDA: Normal

PLA: Normal
IMPRESSION: 1.  Calcium score 0

2. Essentially normal right dominant coronary arteries Only 1-24%
soft plaque in mid LAD CAD RADS 1

3.  Normal aortic root 3.2 cm

Note we could not get patients HR under 70 despite above meds.
Consider alternative testing in [place]

Tra Stenson

EXAM:
OVER-READ INTERPRETATION  CT CHEST

The following report is an over-read performed by radiologist Dr.
does not include interpretation of cardiac or coronary anatomy or
pathology. The coronary calcium score/coronary CTA interpretation by
the cardiologist is attached.
FINDINGS: The visualized portions of the lower lung fields show no suspicious
nodules, masses, or infiltrates. The visualized portions of the
mediastinum and chest wall are unremarkable.
IMPRESSION: No significant non-cardiovascular abnormality seen in visualized
portion of the thorax.

*** End of Addendum ***
FINDINGS: Non-cardiac: See separate report from [REDACTED]. No
significant findings on limited lung and soft tissue windows.

Calcium Score: No calcium noted

Coronary Arteries: Right dominant with no anomalies

LM: Normal

LAD: 1-24% soft plaque in mid LAD

D1: normal

D2: Normal

D3: Normal

D4: Normal

Circumflex: Normal

OM1: Normal

OM2: Normal

RCA: Normal

PDA: Normal

PLA: Normal
IMPRESSION: 1.  Calcium score 0

2. Essentially normal right dominant coronary arteries Only 1-24%
soft plaque in mid LAD CAD RADS 1

3.  Normal aortic root 3.2 cm

Note we could not get patients HR under 70 despite above meds.
Consider alternative testing in [place]

Tra Stenson

## 2020-10-06 ENCOUNTER — Other Ambulatory Visit: Payer: Self-pay

## 2020-10-06 MED ORDER — LOSARTAN POTASSIUM 25 MG PO TABS: 25.0000 mg | ORAL_TABLET | Freq: Every day | ORAL | 0 refills | Status: DC

## 2020-11-26 ENCOUNTER — Other Ambulatory Visit: Payer: Self-pay | Admitting: Cardiovascular Disease

## 2021-03-12 DIAGNOSIS — I10 Essential (primary) hypertension: Secondary | ICD-10-CM | POA: Diagnosis not present

## 2021-03-12 DIAGNOSIS — E782 Mixed hyperlipidemia: Secondary | ICD-10-CM | POA: Diagnosis not present

## 2021-03-12 DIAGNOSIS — N529 Male erectile dysfunction, unspecified: Secondary | ICD-10-CM | POA: Diagnosis not present

## 2021-03-16 ENCOUNTER — Telehealth: Payer: Self-pay | Admitting: Cardiovascular Disease

## 2021-03-16 MED ORDER — LOSARTAN POTASSIUM 25 MG PO TABS: ORAL_TABLET | ORAL | 0 refills | Status: DC

## 2021-03-16 NOTE — Telephone Encounter (Signed)
Pt's medication was sent to pt's pharmacy as requested. Confirmation received.  °

## 2021-03-16 NOTE — Telephone Encounter (Signed)
?*  STAT* If patient is at the pharmacy, call can be transferred to refill team. ? ? ?1. Which medications need to be refilled? (please list name of each medication and dose if known) losartan (COZAAR) 25 MG tablet ? ?2. Which pharmacy/location (including street and city if local pharmacy) is medication to be sent to? [id number] - [place], [place] - [address] AT [facility] [address] ? ?3. Do they need a 30 day or 90 day supply? 90 ? ?Patient is out of medication. Patient has OV on 6/2 ? ?

## 2021-05-28 NOTE — Progress Notes (Deleted)
CARDIOLOGY OFFICE NOTE  Date:  05/28/2021    Bradley Mata Date of Birth: [birth year] Medical Record #[id number]  PCP:  Bradley Gosselin, MD  Cardiologist:  Bradley Mata      History of Present Illness: Bradley Mata is a 56 y.o. male  I have not seen since 2019 Last seen by NP Bradley Mata 2021    He has a history of HTN and atypical chest pain - remote normal stress echo in 2014. He smokes - likes cigars. He has chronically abnormal EKG.    Last seen in February of 2019 by me EKG abnormal - stress echo was ordered but the patient did not show - thus not done.   Cardiac CTA done 07/25/18 calcium score 0 CAD RADS 1 only 1-24% soft plaque in mid LAD Note HR 73 during scan despite 20 mg iv lopressor and 10 mg cardizem  *** .   Past Medical History:  Diagnosis Date   CAD (coronary artery disease)    HTN (hypertension)    Tobacco abuse     No past surgical history on file.   Medications: No outpatient medications have been marked as taking for the 06/05/21 encounter (Appointment) with Bradley Stade, MD.     Allergies: No Known Allergies  Social History: The patient  reports that he has quit smoking. He has never used smokeless tobacco. He reports that he does not drink alcohol and does not use drugs.   Family History: The patient's family history includes Cancer in his mother.   Review of Systems: Please see the history of present illness.   All other systems are reviewed and negative.   Physical Exam: VS:  There were no vitals taken for this visit. Marland Kitchen  BMI There is no height or weight on file to calculate BMI.  Wt Readings from Last 3 Encounters:  09/05/19 189 lb (85.7 kg)  07/04/18 187 lb 6.4 oz (85 kg)  02/01/17 194 lb 8 oz (88.2 kg)   Affect appropriate Overweight male  HEENT: normal Neck supple with no adenopathy JVP normal no bruits no thyromegaly Lungs clear with no wheezing and good diaphragmatic motion Heart:  S1/S2 no murmur, no rub, gallop or  click PMI normal Abdomen: benighn, BS positve, no tenderness, no AAA no bruit.  No HSM or HJR Distal pulses intact with no bruits No edema Neuro non-focal Skin warm and dry No muscular weakness    LABORATORY DATA:  EKG:   SR rate 86 chronic biphasic inferior lateral T waves 09/05/19   Lab Results  Component Value Date   WBC 5.0 09/05/2019   HGB 14.2 09/05/2019   HCT 39.9 09/05/2019   PLT 230 09/05/2019   GLUCOSE 109 (H) 09/05/2019   CHOL 178 09/05/2019   TRIG 134 09/05/2019   HDL 38 (L) 09/05/2019   LDLCALC 116 (H) 09/05/2019   ALT 17 09/05/2019   AST 22 09/05/2019   NA 137 09/05/2019   K 4.2 09/05/2019   CL 100 09/05/2019   CREATININE 1.19 09/05/2019   BUN 13 09/05/2019   CO2 23 09/05/2019       BNP (last 3 results) No results for input(s): BNP in the last 8760 hours.  ProBNP (last 3 results) No results for input(s): PROBNP in the last 8760 hours.   Other Studies Reviewed Today:  CORONARY CT 07/2018  IMPRESSION: 1.  Calcium score 0   2. Essentially normal right dominant coronary arteries Only 1-24% soft plaque in mid LAD  CAD RADS 1   3.  Normal aortic root 3.2 cm   Note we could not get patients HR under 70 despite above meds. Consider alternative testing in future   Bradley Mata   Stress Echo Study Conclusions from 12/2012  - Stress ECG conclusions: The stress ECG was normal. - Staged echo: Normal echo stress Impressions:  - No evidence for ischemia. Hypertensive BP response.   Assessment/Plan:  1. HTN - normal continue ARB  2. CAD - very mild by coronary CT -   07/25/18 only soft plaque in mid LAD and calcium score 0 ***  3. Atypical chest pain - resolved. See above   4. Chronically abnormal EKG. Discussed echo to assess for HOCM/LVH  5. Cigar use - total cessation encouraged. CXR  6. HLD:  LDL 116 in 2021 ***  Current medicines are reviewed with the patient today.  The patient does not have concerns regarding medicines other than  what has been noted above.  The following changes have been made:  See above.  Labs/ tests ordered today include:   Echo for abnormal ECG CXR  cigar smoking  Lipid/Liver    No orders of the defined types were placed in this encounter.    Disposition:   FU in a year     Patient is agreeable to this plan and will call if any problems develop in the interim.   Signed: Charlton Haws, MD  05/28/2021 5:36 PM  [facility] [address] [place], [place]  [address] Phone: [phone] Fax: [phone]

## 2021-06-04 NOTE — Progress Notes (Signed)
CARDIOLOGY OFFICE NOTE  Date:  06/08/2021    Bradley Mata Date of Birth: December 17, 1965 Medical Record #[id number]  PCP:  Catha Gosselin, MD  Cardiologist:  New      History of Present Illness: Bradley Mata is a 56 y.o. male  I have not seen since 2019 Last seen by NP Norma Fredrickson 2021    He has a history of HTN and atypical chest pain - remote normal stress echo in 2014. He smokes - likes cigars. He has chronically abnormal EKG.    Last seen in February of 2019 by me EKG abnormal - stress echo was ordered but the patient did not show - thus not done.   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   Past Medical History:  Diagnosis Date   CAD (coronary artery disease)    HTN (hypertension)    Tobacco abuse     History reviewed. No pertinent surgical history.   Medications: Current Meds  Medication Sig   aspirin EC 81 MG tablet Take 1 tablet (81 mg total) by mouth daily. Swallow whole.   atorvastatin (LIPITOR) 10 MG tablet Take 10 mg by mouth daily.   losartan (COZAAR) 25 MG tablet TAKE 1 TABLET(25 MG) BY MOUTH DAILY. Please keep upcoming appt in June 2023 with Dr. Eden Emms before anymore refills. Thank you Final Attempt   Multiple Vitamins-Minerals (MULTIVITAMIN ADULT PO) Take 2 tablets by mouth daily.     Allergies: No Known Allergies  Social History: The patient  reports that he has quit smoking. He has never used smokeless tobacco. He reports that he does not drink alcohol and does not use drugs.   Family History: The patient's family history includes Cancer in his mother.   Review of Systems: Please see the history of present illness.   All other systems are reviewed and negative.   Physical Exam: VS:  BP 120/88   Pulse (!) 103   Ht 5\' 9"   (1.753 m)   Wt 191 lb (86.6 kg)   SpO2 97%   BMI 28.21 kg/m  .  BMI Body mass index is 28.21 kg/m.  Wt Readings from Last 3 Encounters:  06/08/21 191 lb (86.6 kg)  09/05/19 189 lb (85.7 kg)  07/04/18 187 lb 6.4 oz (85 kg)   Affect appropriate Overweight male  HEENT: normal Neck supple with no adenopathy JVP normal no bruits no thyromegaly Lungs clear with no wheezing and good diaphragmatic motion Heart:  S1/S2 no murmur, no rub, gallop or click PMI normal Abdomen: benighn, BS positve, no tenderness, no AAA no bruit.  No HSM or HJR Distal pulses intact with no bruits No edema Neuro non-focal Skin warm and dry No muscular weakness    LABORATORY DATA:  EKG:   SR rate 86 chronic biphasic inferior lateral T waves 09/05/19 06/08/2021 NSR rate 103 nonspecific inferior ST changes   Lab Results  Component Value Date   WBC 5.0 09/05/2019   HGB 14.2 09/05/2019   HCT 39.9 09/05/2019   PLT 230 09/05/2019   GLUCOSE 109 (H) 09/05/2019   CHOL 178 09/05/2019   TRIG 134 09/05/2019   HDL 38 (L) 09/05/2019   LDLCALC 116 (H) 09/05/2019   ALT 17 09/05/2019  AST 22 09/05/2019   NA 137 09/05/2019   K 4.2 09/05/2019   CL 100 09/05/2019   CREATININE 1.19 09/05/2019   BUN 13 09/05/2019   CO2 23 09/05/2019       BNP (last 3 results) No results for input(s): BNP in the last 8760 hours.  ProBNP (last 3 results) No results for input(s): PROBNP in the last 8760 hours.   Other Studies Reviewed Today:  CORONARY CT 07/2018  IMPRESSION: 1.  Calcium score 0   2. Essentially normal right dominant coronary arteries Only 1-24% soft plaque in mid LAD CAD RADS 1   3.  Normal aortic root 3.2 cm   Note we could not get patients HR under 70 despite above meds. Consider alternative testing in future   Charlton Haws   Stress Echo Study Conclusions from 12/2012  - Stress ECG conclusions: The stress ECG was normal. - Staged echo: Normal echo stress Impressions:  - No evidence for  ischemia. Hypertensive BP response.   Assessment/Plan:  1. HTN - normal continue ARB  2. CAD - very mild by coronary CT -   07/25/18 only soft plaque in mid LAD and calcium score 0 no chest pain stable  3. Atypical chest pain - resolved. See above   4. Chronically abnormal EKG. Discussed echo to assess for HOCM/LVH  5. Cigar use - total cessation encouraged. CXR  6. HLD:  LDL 116 in 2021 labs today   Current medicines are reviewed with the patient today.  The patient does not have concerns regarding medicines other than what has been noted above.  The following changes have been made:  See above.  Labs/ tests ordered today include:   CXR  cigar smoking  Labs including Lipid/Liver    Orders Placed This Encounter  Procedures   DG Chest 2 View   Hemoglobin A1c   Lipid panel   PSA   TSH   Comprehensive metabolic panel   CBC with Differential/Platelet   EKG 12-Lead     Disposition:   FU in a year     Patient is agreeable to this plan and will call if any problems develop in the interim.   Signed: Charlton Haws, MD  06/08/2021 9:40 AM  [facility] [address] [place], [place]  [address] Phone: [phone] Fax: [phone]

## 2021-06-05 ENCOUNTER — Ambulatory Visit: Payer: BC Managed Care – PPO | Admitting: Cardiovascular Disease

## 2021-06-08 ENCOUNTER — Ambulatory Visit: Payer: BC Managed Care – PPO | Admitting: Cardiovascular Disease

## 2021-06-08 ENCOUNTER — Encounter: Payer: Self-pay | Admitting: Cardiovascular Disease

## 2021-06-08 ENCOUNTER — Ambulatory Visit
Admission: RE | Admit: 2021-06-08 | Discharge: 2021-06-08 | Disposition: A | Discharge status: Home / Self Care | Payer: BC Managed Care – PPO | Source: Ambulatory Visit | Attending: Cardiovascular Disease | Admitting: Cardiovascular Disease

## 2021-06-08 VITALS — BP 120/88 | HR 103 | Ht 69.0 in | Wt 191.0 lb

## 2021-06-08 DIAGNOSIS — I2583 Coronary atherosclerosis due to lipid rich plaque: Secondary | ICD-10-CM | POA: Diagnosis not present

## 2021-06-08 DIAGNOSIS — Z87891 Personal history of nicotine dependence: Secondary | ICD-10-CM

## 2021-06-08 DIAGNOSIS — I10 Essential (primary) hypertension: Secondary | ICD-10-CM

## 2021-06-08 DIAGNOSIS — I2510 Atherosclerotic heart disease of native coronary artery without angina pectoris: Secondary | ICD-10-CM | POA: Diagnosis not present

## 2021-06-08 DIAGNOSIS — R9431 Abnormal electrocardiogram [ECG] [EKG]: Secondary | ICD-10-CM | POA: Diagnosis not present

## 2021-06-08 DIAGNOSIS — F17210 Nicotine dependence, cigarettes, uncomplicated: Secondary | ICD-10-CM | POA: Diagnosis not present

## 2021-06-08 LAB — TSH: TSH: 0.795 u[IU]/mL (ref 0.450–4.500)

## 2021-06-08 LAB — COMPREHENSIVE METABOLIC PANEL
ALT: 19 IU/L (ref 0–44)
AST: 23 IU/L (ref 0–40)
Albumin/Globulin Ratio: 1.7 (ref 1.2–2.2)
Albumin: 4.5 g/dL (ref 3.8–4.9)
Alkaline Phosphatase: 40 IU/L — ABNORMAL LOW (ref 44–121)
BUN/Creatinine Ratio: 13 (ref 9–20)
BUN: 16 mg/dL (ref 6–24)
Bilirubin Total: 0.4 mg/dL (ref 0.0–1.2)
CO2: 23 mmol/L (ref 20–29)
Calcium: 8.9 mg/dL (ref 8.7–10.2)
Chloride: 104 mmol/L (ref 96–106)
Creatinine, Ser: 1.24 mg/dL (ref 0.76–1.27)
Globulin, Total: 2.6 g/dL (ref 1.5–4.5)
Glucose: 114 mg/dL — ABNORMAL HIGH (ref 70–99)
Potassium: 4.6 mmol/L (ref 3.5–5.2)
Sodium: 138 mmol/L (ref 134–144)
Total Protein: 7.1 g/dL (ref 6.0–8.5)
eGFR: 68 mL/min/{1.73_m2} (ref >59)

## 2021-06-08 LAB — CBC WITH DIFFERENTIAL/PLATELET
Basophils Absolute: 0.1 10*3/uL (ref 0.0–0.2)
Basos: 1 %
EOS (ABSOLUTE): 0.2 10*3/uL (ref 0.0–0.4)
Eos: 4 %
Hematocrit: 39.4 % (ref 37.5–51.0)
Hemoglobin: 13.9 g/dL (ref 13.0–17.7)
Immature Grans (Abs): 0 10*3/uL (ref 0.0–0.1)
Immature Granulocytes: 0 %
Lymphocytes Absolute: 1.6 10*3/uL (ref 0.7–3.1)
Lymphs: 30 %
MCH: 30.8 pg (ref 26.6–33.0)
MCHC: 35.3 g/dL (ref 31.5–35.7)
MCV: 87 fL (ref 79–97)
Monocytes Absolute: 0.5 10*3/uL (ref 0.1–0.9)
Monocytes: 10 %
Neutrophils Absolute: 2.9 10*3/uL (ref 1.4–7.0)
Neutrophils: 55 %
Platelets: 239 10*3/uL (ref 150–450)
RBC: 4.52 x10E6/uL (ref 4.14–5.80)
RDW: 12.4 % (ref 11.6–15.4)
WBC: 5.4 10*3/uL (ref 3.4–10.8)

## 2021-06-08 LAB — LIPID PANEL
Chol/HDL Ratio: 2.9 ratio (ref 0.0–5.0)
Cholesterol, Total: 113 mg/dL (ref 100–199)
HDL: 39 mg/dL — ABNORMAL LOW (ref >39)
LDL Chol Calc (NIH): 59 mg/dL (ref 0–99)
Triglycerides: 71 mg/dL (ref 0–149)
VLDL Cholesterol Cal: 15 mg/dL (ref 5–40)

## 2021-06-08 LAB — HEMOGLOBIN A1C
Est. average glucose Bld gHb Est-mCnc: 108 mg/dL
Hgb A1c MFr Bld: 5.4 % (ref 4.8–5.6)

## 2021-06-08 LAB — PSA: Prostate Specific Ag, Serum: 1.1 ng/mL (ref 0.0–4.0)

## 2021-06-08 NOTE — Patient Instructions (Signed)
Medication Instructions:  Your physician recommends that you continue on your current medications as directed. Please refer to the Current Medication list given to you today.  *If you need a refill on your cardiac medications before your next appointment, please call your pharmacy*  Lab Work: Your physician recommends that you have lab work today- CMET, CBC, PSA, TSH, HgbA1c, and Lipid panel.  If you have labs (blood work) drawn today and your tests are completely normal, you will receive your results only by: MyChart Message (if you have MyChart) OR A paper copy in the mail If you have any lab test that is abnormal or we need to change your treatment, we will call you to review the results.  Testing/Procedures: A chest x-ray takes a picture of the organs and structures inside the chest, including the heart, lungs, and blood vessels. This test can show several things, including, whether the heart is enlarges; whether fluid is building up in the lungs; and whether pacemaker / defibrillator leads are still in place.   Follow-Up: At [facility], you and your health needs are our priority.  As part of our continuing mission to provide you with exceptional heart care, we have created designated Provider Care Teams.  These Care Teams include your primary Cardiologist (physician) and Advanced Practice Providers (APPs -  Physician Assistants and Nurse Practitioners) who all work together to provide you with the care you need, when you need it.  We recommend signing up for the patient portal called "MyChart".  Sign up information is provided on this After Visit Summary.  MyChart is used to connect with patients for Virtual Visits (Telemedicine).  Patients are able to view lab/test results, encounter notes, upcoming appointments, etc.  Non-urgent messages can be sent to your provider as well.   To learn more about what you can do with MyChart, go to [email or web address].    Your next appointment:    1 year(s)  The format for your next appointment:   In Person  Provider:   Charlton Haws, MD {   Important Information About Sugar

## 2021-06-09 ENCOUNTER — Other Ambulatory Visit: Payer: Self-pay | Admitting: Cardiovascular Disease

## 2021-07-03 DIAGNOSIS — R21 Rash and other nonspecific skin eruption: Secondary | ICD-10-CM | POA: Diagnosis not present

## 2021-11-09 DIAGNOSIS — R051 Acute cough: Secondary | ICD-10-CM | POA: Diagnosis not present

## 2021-11-09 DIAGNOSIS — Z03818 Encounter for observation for suspected exposure to other biological agents ruled out: Secondary | ICD-10-CM | POA: Diagnosis not present

## 2021-11-09 DIAGNOSIS — J209 Acute bronchitis, unspecified: Secondary | ICD-10-CM | POA: Diagnosis not present

## 2021-11-23 DIAGNOSIS — B9689 Other specified bacterial agents as the cause of diseases classified elsewhere: Secondary | ICD-10-CM | POA: Diagnosis not present

## 2021-11-23 DIAGNOSIS — Z6827 Body mass index (BMI) 27.0-27.9, adult: Secondary | ICD-10-CM | POA: Diagnosis not present

## 2021-11-23 DIAGNOSIS — J208 Acute bronchitis due to other specified organisms: Secondary | ICD-10-CM | POA: Diagnosis not present

## 2022-02-17 NOTE — Progress Notes (Signed)
Cardiology Clinic Note   Patient Name: Bradley Mata Date of Encounter: 02/19/2022  Primary Care Provider:  Hulan Fess, MD Primary Cardiologist:  Jenkins Rouge, MD  Patient Profile    57 year old male with history of HTN, non cardiac chest pain, Cardiac CTA 0,CAD RADS 1 only 1-24% soft plaque in mid LAD Note HR 73 during scan despite 20 mg iv lopressor and 10 mg Cardizem, HL,  ongoing tobacco abuse with cigars.   Past Medical History    Past Medical History:  Diagnosis Date   CAD (coronary artery disease)    HTN (hypertension)    Tobacco abuse    No past surgical history on file.  Allergies  No Known Allergies  History of Present Illness    Bradley Mata is a 57 year old male we are following for ongoing assessment and management of HTN, HL, non-cardiac chest pain with normal stress echo, cardiac CTA of O, with CVRF of ongoing tobacco abuse and HL.   Bradley Mata comes today with complaints of left arm numbness and tingling especially when he is stressed.  He states that part of his arm get tingly, sometimes his hands, comes and goes.  He has not injured himself in any way that he can remember, he is not carrying or lifting heavy objects in the left arm.  He has not had any new medications or life changes.  He is medically compliant.  Home Medications    Current Outpatient Medications  Medication Sig Dispense Refill   aspirin EC 81 MG tablet Take 1 tablet (81 mg total) by mouth daily. Swallow whole. 90 tablet 3   atorvastatin (LIPITOR) 10 MG tablet Take 10 mg by mouth daily.     losartan (COZAAR) 25 MG tablet TAKE 1 TABLET BY MOUTH EVERY DAY 90 tablet 3   Multiple Vitamins-Minerals (MULTIVITAMIN ADULT PO) Take 2 tablets by mouth daily.     No current facility-administered medications for this visit.     Family History    Family History  Problem Relation Age of Onset   Cancer Mother    He indicated that his mother is deceased. He indicated that his father is  deceased. He indicated that his sister is alive. He indicated that his brother is alive. He indicated that his maternal grandmother is deceased. He indicated that his maternal grandfather is deceased. He indicated that his paternal grandmother is deceased. He indicated that his paternal grandfather is deceased.  Social History    Social History   Socioeconomic History   Marital status: Single    Spouse name: Not on file   Number of children: Not on file   Years of education: Not on file   Highest education level: Not on file  Occupational History   Not on file  Tobacco Use   Smoking status: Former   Smokeless tobacco: Never  Vaping Use   Vaping Use: Never used  Substance and Sexual Activity   Alcohol use: Never   Drug use: Never   Sexual activity: Not on file  Other Topics Concern   Not on file  Social History Narrative   Not on file   Social Determinants of Health   Financial Resource Strain: Not on file  Food Insecurity: Not on file  Transportation Needs: Not on file  Physical Activity: Not on file  Stress: Not on file  Social Connections: Not on file  Intimate Partner Violence: Not on file     Review of Systems  General:  No chills, fever, night sweats or weight changes.  Cardiovascular:  No chest pain, dyspnea on exertion, edema, orthopnea, palpitations, paroxysmal nocturnal dyspnea. Dermatological: No rash, lesions/masses Respiratory: No cough, dyspnea Urologic: No hematuria, dysuria Abdominal:   No nausea, vomiting, diarrhea, bright red blood per rectum, melena, or hematemesis Neurologic:  No visual changes, wkns, changes in mental status.  Positive for left bicep, forearm, hand tingling, comes and goes.  No changes in tactile functioning. All other systems reviewed and are otherwise negative except as noted above.     Physical Exam    VS:  BP 132/80   Pulse (!) 104   Ht 5' 9"$  (1.753 m)   Wt 183 lb 12.8 oz (83.4 kg)   SpO2 99%   BMI 27.14 kg/m  ,  BMI Body mass index is 27.14 kg/m.     GEN: Well nourished, well developed, in no acute distress. HEENT: normal. Neck: Supple, no JVD, carotid bruits, or masses. Cardiac: RRR, tachycardic, no murmurs, rubs, or gallops. No clubbing, cyanosis, edema.  Radials/DP/PT 2+ and equal bilaterally.  Respiratory:  Respirations regular and unlabored, clear to auscultation bilaterally. GI: Soft, nontender, nondistended, BS + x 4. MS: no deformity or atrophy.  Normal range of motion of neck and spine. Skin: warm and dry, no rash. Neuro:  Strength and sensation are normal however he with physical exam he states his arm is tingling with range of motion. Psych: Normal affect.  Accessory Clinical Findings    ECG personally reviewed by me today-sinus tachycardia heart rate 104 bpm otherwise normal.  Lab Results  Component Value Date   WBC 5.4 06/08/2021   HGB 13.9 06/08/2021   HCT 39.4 06/08/2021   MCV 87 06/08/2021   PLT 239 06/08/2021   Lab Results  Component Value Date   CREATININE 1.24 06/08/2021   BUN 16 06/08/2021   NA 138 06/08/2021   K 4.6 06/08/2021   CL 104 06/08/2021   CO2 23 06/08/2021   Lab Results  Component Value Date   ALT 19 06/08/2021   AST 23 06/08/2021   ALKPHOS 40 (L) 06/08/2021   BILITOT 0.4 06/08/2021   Lab Results  Component Value Date   CHOL 113 06/08/2021   HDL 39 (L) 06/08/2021   LDLCALC 59 06/08/2021   TRIG 71 06/08/2021   CHOLHDL 2.9 06/08/2021    Lab Results  Component Value Date   HGBA1C 5.4 06/08/2021    Review of Prior Studies: Cardiac CTA 07/25/2018  1.  Calcium score 0   2. Essentially normal right dominant coronary arteries Only 1-24% soft plaque in mid LAD CAD RADS 1   3.  Normal aortic root 3.2 cm  Assessment & Plan   1.  Musculoskeletal neuropathy: He appears to be having more musculoskeletal numbness and tingling in the left arm.  There is no decrease in range of motion, he is able to rotate his head and neck without pain.   However the numbness and tingling does change and sometimes stop with this activity in the clinic office.  Is recommended that he undergo a MRI of the cervical spine to rule out disc impingement.  I discussed this with him and he would like to proceed.  This will be ordered with cardiology will not treat.  He will follow-up with his primary care provider next month to discuss the test results and further recommendations or referrals if necessary.  2.  Hyperlipidemia: Remains on statin therapy.  Do not believe left arm symptoms  are related to myopathy.  This is being followed by his PCP.  Continue with atorvastatin 10 mg daily.  3.  Mild CAD: Patient had cardiac CTA with his score of 0, only 1 to 24% soft plaque in the mid LAD.  Continue cardiovascular risk factor management with low-cholesterol diet, blood pressure management, weight management, and ongoing surveillance for new symptoms.  4.  Hypertension: Currently well-controlled on losartan 25 mg daily.  Labs are followed by PCP.    Current medicines are reviewed at length with the patient today.  I have spent 25 min's  dedicated to the care of this patient on the date of this encounter to include pre-visit review of records, assessment, management and diagnostic testing,with shared decision making.   Signed, Phill Myron. West Pugh, ANP, AACC   02/19/2022 8:57 AM      Office [phone] Fax [phone]  Notice: This dictation was prepared with Dragon dictation along with smaller phrase technology. Any transcriptional errors that result from this process are unintentional and may not be corrected upon review.

## 2022-02-19 ENCOUNTER — Ambulatory Visit: Payer: BC Managed Care – PPO | Attending: Adult Health | Admitting: Adult Health

## 2022-02-19 ENCOUNTER — Encounter: Payer: Self-pay | Admitting: Adult Health

## 2022-02-19 VITALS — BP 132/80 | HR 104 | Ht 69.0 in | Wt 183.8 lb

## 2022-02-19 DIAGNOSIS — E7800 Pure hypercholesterolemia, unspecified: Secondary | ICD-10-CM

## 2022-02-19 DIAGNOSIS — M79602 Pain in left arm: Secondary | ICD-10-CM

## 2022-02-19 DIAGNOSIS — I10 Essential (primary) hypertension: Secondary | ICD-10-CM

## 2022-02-19 NOTE — Patient Instructions (Signed)
Medication Instructions:  No Changes *If you need a refill on your cardiac medications before your next appointment, please call your pharmacy*   Lab Work: No labs If you have labs (blood work) drawn today and your tests are completely normal, you will receive your results only by: [place] (if you have MyChart) OR A paper copy in the mail If you have any lab test that is abnormal or we need to change your treatment, we will call you to review the results.   Testing/Procedures: [facility] , [address]. Your physician has requested that you have a  MRI of Cervical Spine.  Follow-Up: At [facility], you and your health needs are our priority.  As part of our continuing mission to provide you with exceptional heart care, we have created designated Provider Care Teams.  These Care Teams include your primary Cardiologist (physician) and Advanced Practice Providers (APPs -  Physician Assistants and Nurse Practitioners) who all work together to provide you with the care you need, when you need it.  We recommend signing up for the patient portal called "MyChart".  Sign up information is provided on this After Visit Summary.  MyChart is used to connect with patients for Virtual Visits (Telemedicine).  Patients are able to view lab/test results, encounter notes, upcoming appointments, etc.  Non-urgent messages can be sent to your provider as well.   To learn more about what you can do with MyChart, go to [email or web address].    Your next appointment:   1 year(s)  Provider:   Jenkins Rouge, MD

## 2022-03-14 ENCOUNTER — Ambulatory Visit
Admission: RE | Admit: 2022-03-14 | Discharge: 2022-03-14 | Disposition: A | Discharge status: Home / Self Care | Payer: BC Managed Care – PPO | Source: Ambulatory Visit | Attending: Adult Health | Admitting: Adult Health

## 2022-03-14 DIAGNOSIS — R200 Anesthesia of skin: Secondary | ICD-10-CM | POA: Diagnosis not present

## 2022-03-14 DIAGNOSIS — R202 Paresthesia of skin: Secondary | ICD-10-CM | POA: Diagnosis not present

## 2022-03-14 DIAGNOSIS — I10 Essential (primary) hypertension: Secondary | ICD-10-CM

## 2022-03-14 DIAGNOSIS — M47812 Spondylosis without myelopathy or radiculopathy, cervical region: Secondary | ICD-10-CM | POA: Diagnosis not present

## 2022-03-14 DIAGNOSIS — M4802 Spinal stenosis, cervical region: Secondary | ICD-10-CM | POA: Diagnosis not present

## 2022-03-19 ENCOUNTER — Telehealth: Payer: Self-pay

## 2022-03-19 NOTE — Telephone Encounter (Addendum)
Called patient regarding results. Patient had understanding of results.----- Message from Lendon Colonel, NP sent at 03/17/2022  2:29 PM EDT ----- Patient should follow up with his PCP as there are some bone spurs and some stenosis in his cervical spine.  This may be why he is experiencing the pain in his arm, and shoulders especially on the left side.  KL

## 2022-03-22 DIAGNOSIS — Z125 Encounter for screening for malignant neoplasm of prostate: Secondary | ICD-10-CM | POA: Diagnosis not present

## 2022-03-22 DIAGNOSIS — R200 Anesthesia of skin: Secondary | ICD-10-CM | POA: Diagnosis not present

## 2022-03-22 DIAGNOSIS — Z0000 Encounter for general adult medical examination without abnormal findings: Secondary | ICD-10-CM | POA: Diagnosis not present

## 2022-03-22 DIAGNOSIS — N529 Male erectile dysfunction, unspecified: Secondary | ICD-10-CM | POA: Diagnosis not present

## 2022-03-22 DIAGNOSIS — E782 Mixed hyperlipidemia: Secondary | ICD-10-CM | POA: Diagnosis not present

## 2022-03-22 DIAGNOSIS — I10 Essential (primary) hypertension: Secondary | ICD-10-CM | POA: Diagnosis not present

## 2022-03-22 DIAGNOSIS — M79642 Pain in left hand: Secondary | ICD-10-CM | POA: Diagnosis not present

## 2022-04-09 DIAGNOSIS — Z6827 Body mass index (BMI) 27.0-27.9, adult: Secondary | ICD-10-CM | POA: Diagnosis not present

## 2022-04-09 DIAGNOSIS — M50123 Cervical disc disorder at C6-C7 level with radiculopathy: Secondary | ICD-10-CM | POA: Diagnosis not present

## 2022-07-19 ENCOUNTER — Other Ambulatory Visit: Payer: Self-pay | Admitting: Cardiovascular Disease

## 2022-11-24 DIAGNOSIS — R21 Rash and other nonspecific skin eruption: Secondary | ICD-10-CM | POA: Diagnosis not present

## 2022-12-09 DIAGNOSIS — L503 Dermatographic urticaria: Secondary | ICD-10-CM | POA: Diagnosis not present

## 2022-12-09 DIAGNOSIS — L501 Idiopathic urticaria: Secondary | ICD-10-CM | POA: Diagnosis not present

## 2023-01-04 DIAGNOSIS — L503 Dermatographic urticaria: Secondary | ICD-10-CM | POA: Diagnosis not present

## 2023-02-23 NOTE — Progress Notes (Signed)
 CARDIOLOGY OFFICE NOTE  Date:  03/03/2023    [name] Date of Birth: [birth year] Medical Record #[id number]  PCP:  Catha Gosselin, MD  Cardiologist:  New      History of Present Illness: Bradley Mata is a 58 y.o. male  has a history of HTN and atypical chest pain - remote normal stress echo in 2014. He smokes - likes cigars. He has chronically abnormal EKG.    Last seen 2023 EKG abnormal - stress echo was ordered but the patient did not show - thus not done.   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 Pleas Koch a lot has a brother / sister locally Like [place]  Still gets left arm numbness and tingling when he is stressed PA felt MRI cervical spine indicated Reviewed from 3/10/ 24 and did note moderate-severe multilevel spondylosis with foraminal stenosis   Needs refill on Lipitor  .   Past Medical History:  Diagnosis Date   CAD (coronary artery disease)    HTN (hypertension)    Tobacco abuse     No past surgical history on file.   Medications: Current Meds  Medication Sig   aspirin EC 81 MG tablet Take 1 tablet (81 mg total) by mouth daily. Swallow whole.   atorvastatin (LIPITOR) 10 MG tablet Take 10 mg by mouth daily.   losartan (COZAAR) 25 MG tablet TAKE 1 TABLET BY MOUTH EVERY DAY   Multiple Vitamins-Minerals (MULTIVITAMIN ADULT PO) Take 2 tablets by mouth daily.     Allergies: No Known Allergies  Social History: The patient  reports that he has quit smoking. He has never used smokeless tobacco. He reports that he does not drink alcohol and does not use drugs.   Family History: The patient's family history includes Cancer in his mother.   Review of Systems: Please see the history of present illness.   All other systems are  reviewed and negative.   Physical Exam: VS:  BP 118/88   Pulse 85   Ht 5\' 10"  (1.778 m)   Wt 197 lb (89.4 kg)   SpO2 99%   BMI 28.27 kg/m  .  BMI Body mass index is 28.27 kg/m.  Wt Readings from Last 3 Encounters:  03/03/23 197 lb (89.4 kg)  02/19/22 183 lb 12.8 oz (83.4 kg)  06/08/21 191 lb (86.6 kg)   Affect appropriate Overweight male  HEENT: normal Neck supple with no adenopathy JVP normal no bruits no thyromegaly Lungs clear with no wheezing and good diaphragmatic motion Heart:  S1/S2 no murmur, no rub, gallop or click PMI normal Abdomen: benighn, BS positve, no tenderness, no AAA no bruit.  No HSM or HJR Distal pulses intact with no bruits No edema Neuro non-focal Skin warm and dry No muscular weakness    LABORATORY DATA:  EKG:   03/03/2023 SR rate 87 nonspecific ST changes  Lab Results  Component Value Date   WBC 5.4 06/08/2021   HGB 13.9 06/08/2021   HCT 39.4 06/08/2021   PLT 239 06/08/2021   GLUCOSE 114 (H) 06/08/2021   CHOL 113 06/08/2021   TRIG 71 06/08/2021   HDL 39 (L) 06/08/2021   LDLCALC 59 06/08/2021   ALT 19 06/08/2021   AST 23  06/08/2021   NA 138 06/08/2021   K 4.6 06/08/2021   CL 104 06/08/2021   CREATININE 1.24 06/08/2021   BUN 16 06/08/2021   CO2 23 06/08/2021   TSH 0.795 06/08/2021   HGBA1C 5.4 06/08/2021       BNP (last 3 results) No results for input(s): "BNP" in the last 8760 hours.  ProBNP (last 3 results) No results for input(s): "PROBNP" in the last 8760 hours.   Other Studies Reviewed Today:  CORONARY CT 07/2018  IMPRESSION: 1.  Calcium score 0   2. Essentially normal right dominant coronary arteries Only 1-24% soft plaque in mid LAD CAD RADS 1   3.  Normal aortic root 3.2 cm   Note we could not get patients HR under 70 despite above meds. Consider alternative testing in future   Charlton Haws   Stress Echo Study Conclusions from 12/2012  - Stress ECG conclusions: The stress ECG was normal. - Staged  echo: Normal echo stress Impressions:  - No evidence for ischemia. Hypertensive BP response.   Assessment/Plan:  1. HTN - normal continue ARB  2. CAD - very mild by coronary CT -   07/25/18 only soft plaque in mid LAD and calcium score 0 no chest pain stable  3. Atypical chest pain - resolved. See above   4. Chronically abnormal EKG. Discussed echo to assess for HOCM/LVH  5. Cigar use - total cessation encouraged. CXR  6. HLD:  LDL 116 in 2021 labs today   7.  Cervical Spondylosis:  consider f/u with neurosurgery   Current medicines are reviewed with the patient today.  The patient does not have concerns regarding medicines other than what has been noted above.  The following changes have been made:  See above.  Labs/ tests ordered today include:   Lipid/Liver    Disposition:   FU in a year     Patient is agreeable to this plan and will call if any problems develop in the interim.   Signed: Charlton Haws, MD  03/03/2023 9:16 AM  [facility] [address] [place], [place]  [address] Phone: [phone] Fax: [phone]

## 2023-03-03 ENCOUNTER — Encounter: Payer: Self-pay | Admitting: Cardiovascular Disease

## 2023-03-03 ENCOUNTER — Ambulatory Visit: Payer: Commercial Managed Care - HMO | Attending: Cardiovascular Disease | Admitting: Cardiovascular Disease

## 2023-03-03 VITALS — BP 118/88 | HR 85 | Ht 70.0 in | Wt 197.0 lb

## 2023-03-03 DIAGNOSIS — E7800 Pure hypercholesterolemia, unspecified: Secondary | ICD-10-CM | POA: Diagnosis not present

## 2023-03-03 DIAGNOSIS — I10 Essential (primary) hypertension: Secondary | ICD-10-CM

## 2023-03-03 MED ORDER — ATORVASTATIN CALCIUM 10 MG PO TABS: 10.0000 mg | ORAL_TABLET | Freq: Every day | ORAL | 3 refills | Status: AC

## 2023-03-03 NOTE — Patient Instructions (Signed)
 Medication Instructions:  Your physician recommends that you continue on your current medications as directed. Please refer to the Current Medication list given to you today *If you need a refill on your cardiac medications before your next appointment, please call your pharmacy*   Lab Work: Lipids and LFTs  TODAY - please have this completed at [facility] on the first floor of our building If you have labs (blood work) drawn today and your tests are completely normal, you will receive your results only by: MyChart Message (if you have MyChart) OR A paper copy in the mail If you have any lab test that is abnormal or we need to change your treatment, we will call you to review the results.   Follow-Up: At [facility], you and your health needs are our priority.  As part of our continuing mission to provide you with exceptional heart care, we have created designated Provider Care Teams.  These Care Teams include your primary Cardiologist (physician) and Advanced Practice Providers (APPs -  Physician Assistants and Nurse Practitioners) who all work together to provide you with the care you need, when you need it.  We recommend signing up for the patient portal called "MyChart".  Sign up information is provided on this After Visit Summary.  MyChart is used to connect with patients for Virtual Visits (Telemedicine).  Patients are able to view lab/test results, encounter notes, upcoming appointments, etc.  Non-urgent messages can be sent to your provider as well.   To learn more about what you can do with MyChart, go to [email or web address].    Your next appointment:   1 year(s)  Provider:   Charlton Haws, MD

## 2023-04-16 IMAGING — CR DG CHEST 2V
2 series · 2 of 2 positions shown · non-contrast
Comparison: CT cardiac 07/25/2018

CLINICAL DATA: smoker

EXAM:
CHEST - 2 VIEW

[w chest pa]
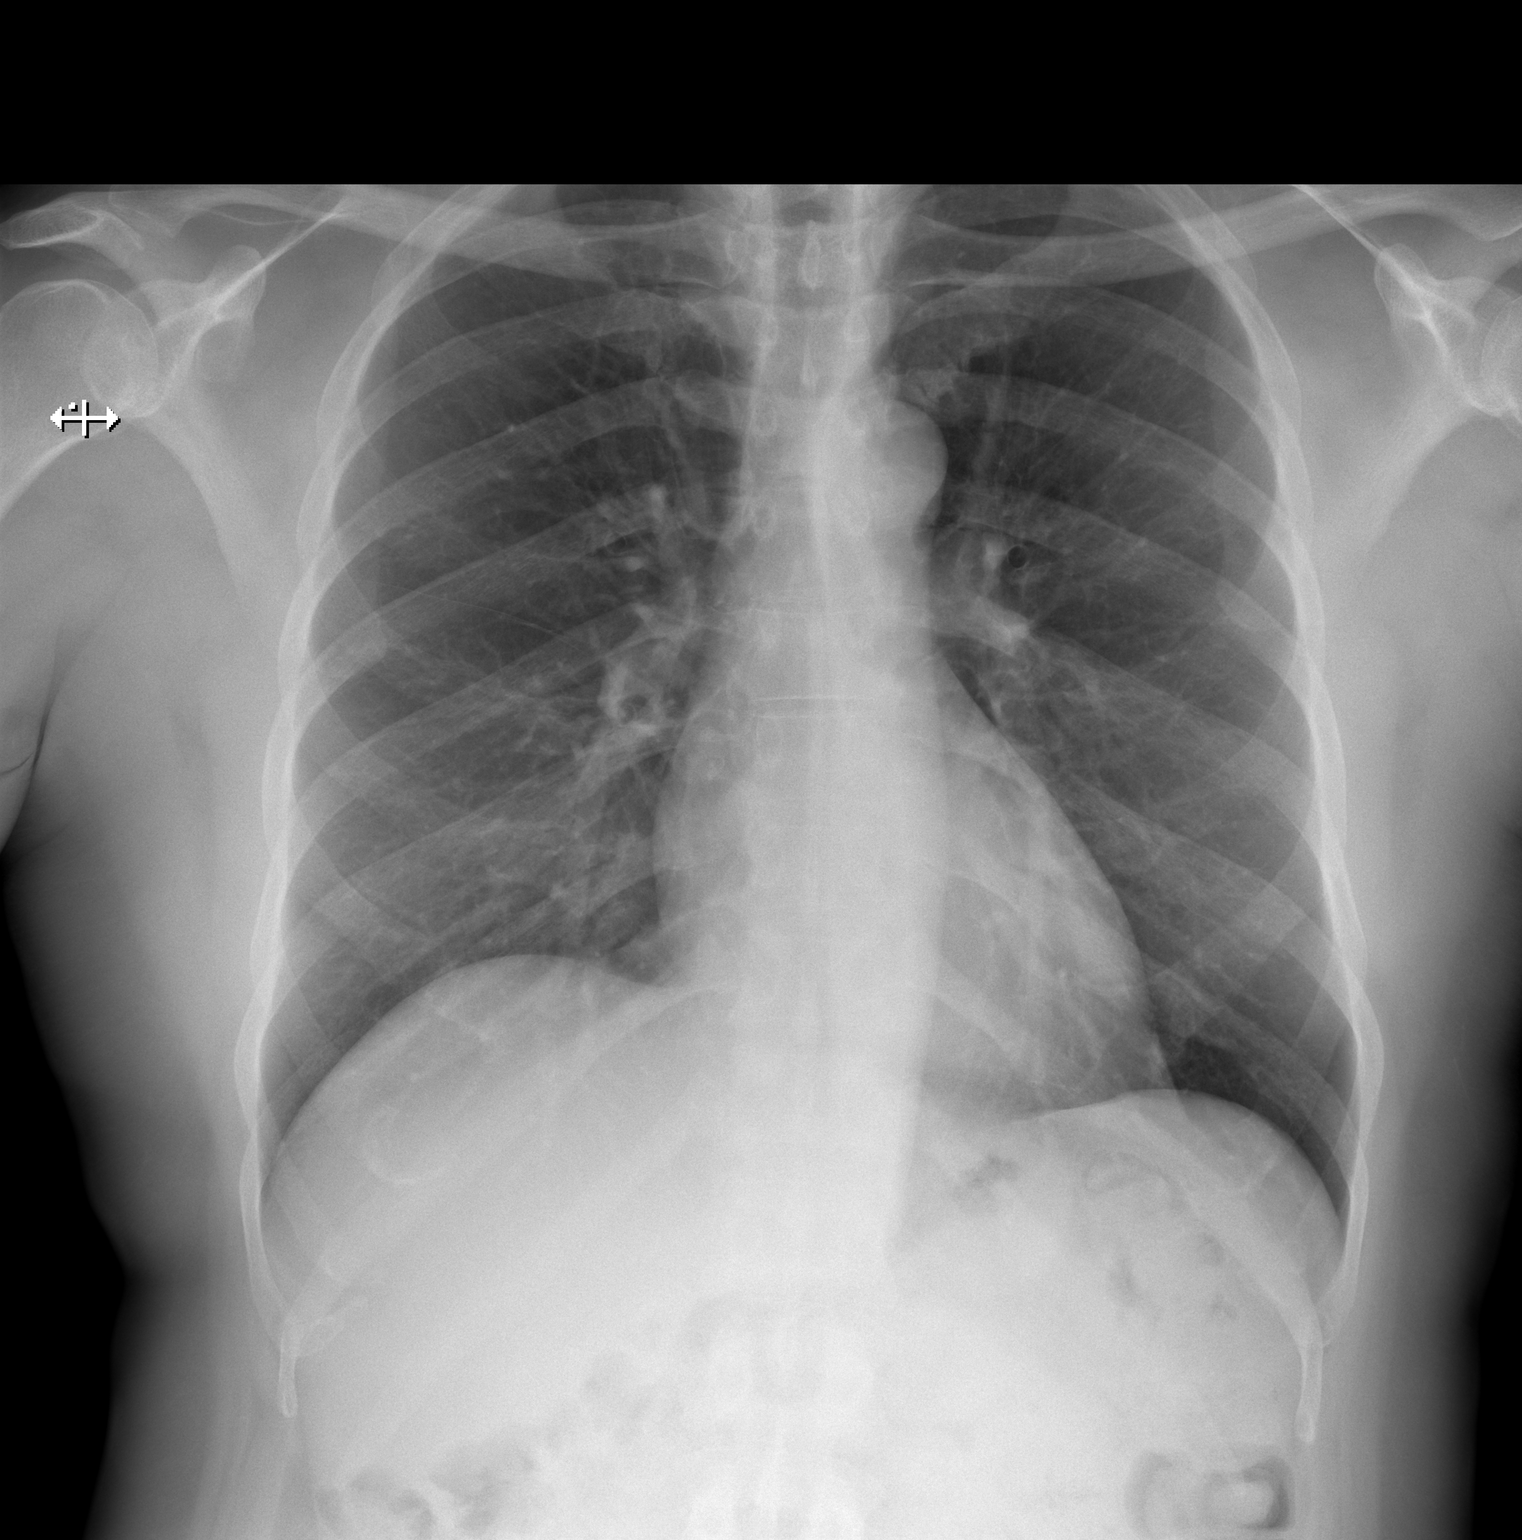

[w chest lat]
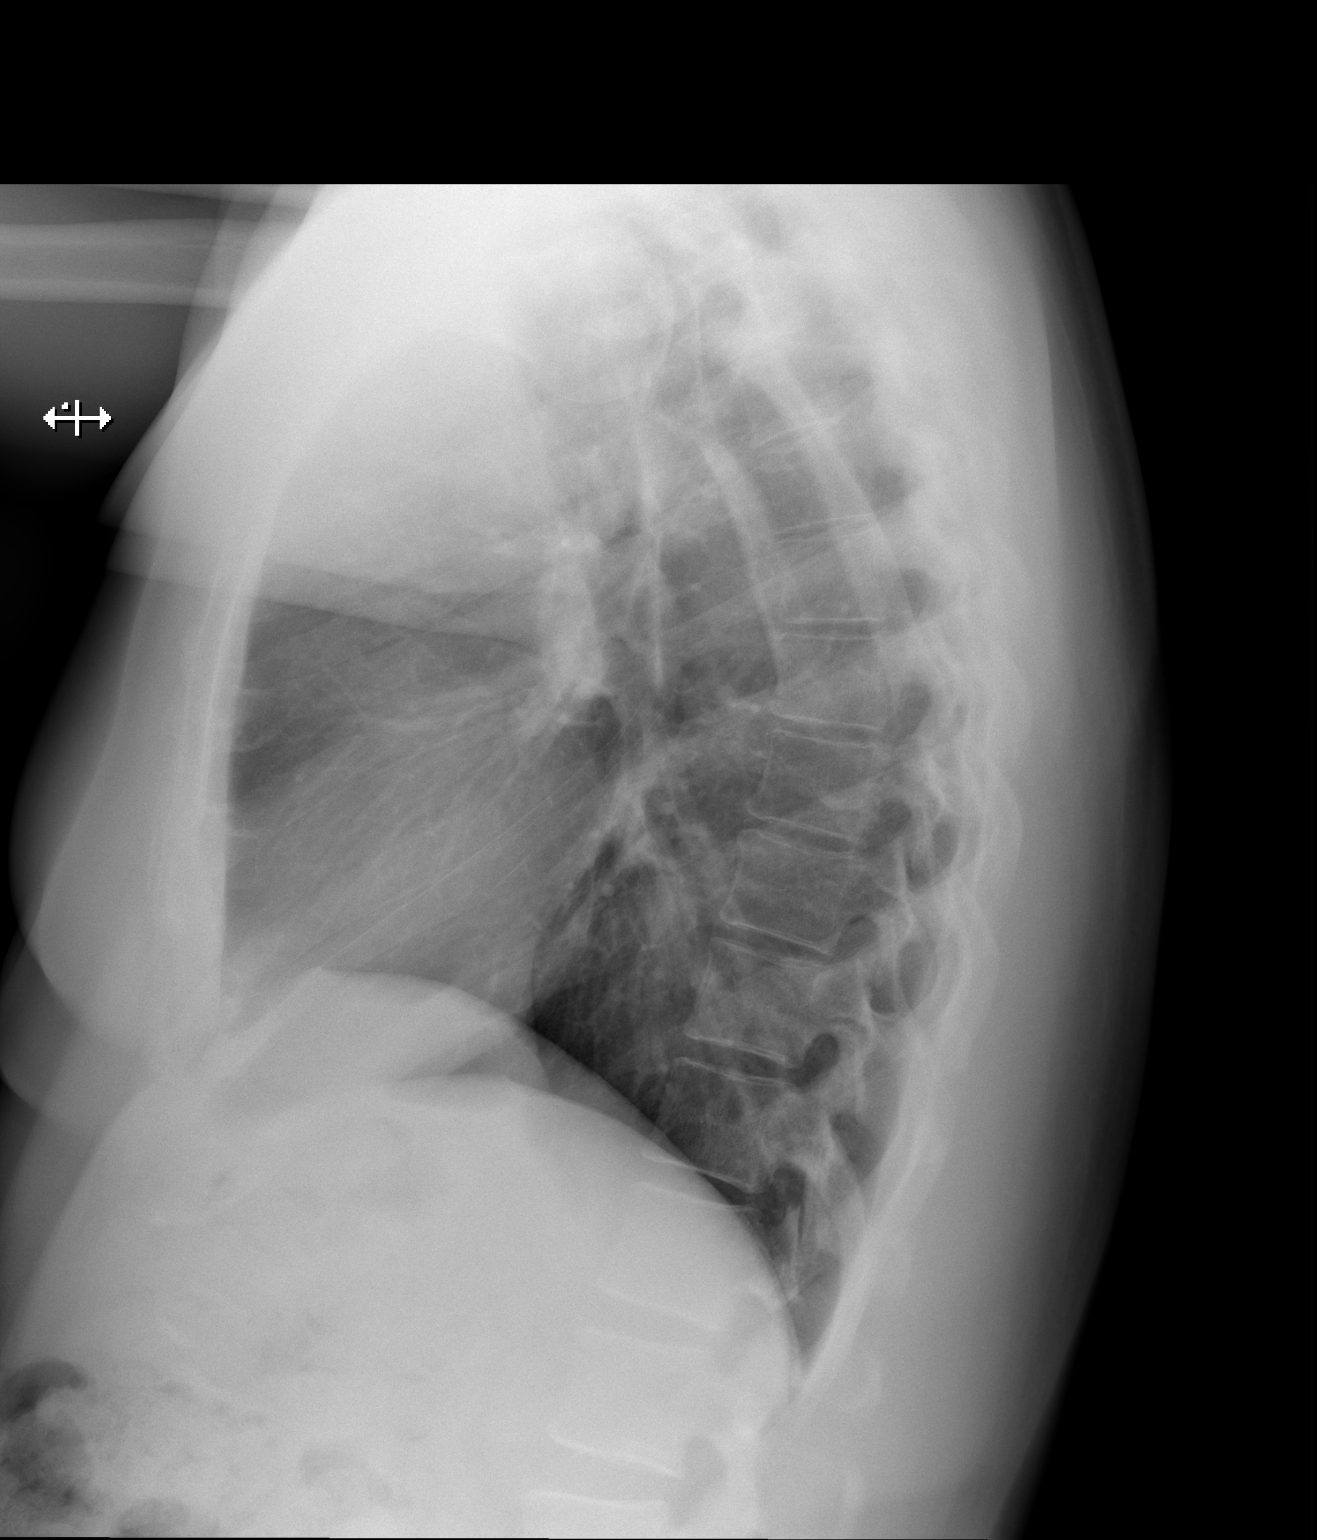

[2 of 2 positions shown; findings below may reference images not displayed]

FINDINGS: The heart and mediastinal contours are within normal limits.

No focal consolidation. No pulmonary edema. No pleural effusion. No
pneumothorax.

No acute osseous abnormality.
IMPRESSION: No active cardiopulmonary disease.
